# Patient Record
Sex: Female | Born: 1970 | Race: White | Hispanic: No | Marital: Married | Smoking: Former smoker
Health system: Southern US, Community
[De-identification: ages and names within clinical notes are randomized; demographics above are authoritative.]

## PROBLEM LIST (undated history)

## (undated) DIAGNOSIS — I10 Essential (primary) hypertension: Secondary | ICD-10-CM

## (undated) DIAGNOSIS — R600 Localized edema: Secondary | ICD-10-CM

## (undated) DIAGNOSIS — R011 Cardiac murmur, unspecified: Secondary | ICD-10-CM

## (undated) DIAGNOSIS — L732 Hidradenitis suppurativa: Secondary | ICD-10-CM

## (undated) DIAGNOSIS — E119 Type 2 diabetes mellitus without complications: Secondary | ICD-10-CM

## (undated) DIAGNOSIS — I729 Aneurysm of unspecified site: Secondary | ICD-10-CM

## (undated) DIAGNOSIS — E785 Hyperlipidemia, unspecified: Secondary | ICD-10-CM

## (undated) DIAGNOSIS — G4730 Sleep apnea, unspecified: Secondary | ICD-10-CM

## (undated) DIAGNOSIS — R350 Frequency of micturition: Secondary | ICD-10-CM

## (undated) DIAGNOSIS — F419 Anxiety disorder, unspecified: Secondary | ICD-10-CM

## (undated) DIAGNOSIS — E538 Deficiency of other specified B group vitamins: Secondary | ICD-10-CM

## (undated) DIAGNOSIS — E559 Vitamin D deficiency, unspecified: Secondary | ICD-10-CM

## (undated) DIAGNOSIS — E1165 Type 2 diabetes mellitus with hyperglycemia: Secondary | ICD-10-CM

## (undated) HISTORY — DX: Aneurysm of unspecified site: I72.9

## (undated) HISTORY — DX: Type 2 diabetes mellitus with hyperglycemia: E11.65

## (undated) HISTORY — DX: Sleep apnea, unspecified: G47.30

## (undated) HISTORY — DX: Hidradenitis suppurativa: L73.2

## (undated) HISTORY — DX: Type 2 diabetes mellitus without complications: E11.9

## (undated) HISTORY — DX: Essential (primary) hypertension: I10

## (undated) HISTORY — DX: Frequency of micturition: R35.0

## (undated) HISTORY — DX: Cardiac murmur, unspecified: R01.1

## (undated) HISTORY — DX: Localized edema: R60.0

## (undated) HISTORY — PX: LEEP: SHX91

## (undated) HISTORY — DX: Deficiency of other specified B group vitamins: E53.8

## (undated) HISTORY — DX: Hyperlipidemia, unspecified: E78.5

## (undated) HISTORY — DX: Vitamin D deficiency, unspecified: E55.9

## (undated) HISTORY — DX: Anxiety disorder, unspecified: F41.9

---

## 1998-07-14 ENCOUNTER — Ambulatory Visit (HOSPITAL_COMMUNITY): Admission: RE | Admit: 1998-07-14 | Discharge: 1998-07-14 | Payer: Self-pay | Admitting: Obstetrics and Gynecology

## 2004-12-03 ENCOUNTER — Other Ambulatory Visit: Admission: RE | Admit: 2004-12-03 | Discharge: 2004-12-03 | Payer: Self-pay | Admitting: Family Medicine

## 2005-04-12 ENCOUNTER — Encounter (INDEPENDENT_AMBULATORY_CARE_PROVIDER_SITE_OTHER): Payer: Self-pay | Admitting: Specialist

## 2005-04-12 ENCOUNTER — Ambulatory Visit (HOSPITAL_COMMUNITY): Admission: RE | Admit: 2005-04-12 | Discharge: 2005-04-12 | Payer: Self-pay | Admitting: Obstetrics and Gynecology

## 2006-01-08 ENCOUNTER — Other Ambulatory Visit: Admission: RE | Admit: 2006-01-08 | Discharge: 2006-01-08 | Payer: Self-pay | Admitting: Obstetrics and Gynecology

## 2007-01-12 ENCOUNTER — Other Ambulatory Visit: Admission: RE | Admit: 2007-01-12 | Discharge: 2007-01-12 | Payer: Self-pay | Admitting: Family Medicine

## 2008-02-22 ENCOUNTER — Other Ambulatory Visit: Admission: RE | Admit: 2008-02-22 | Discharge: 2008-02-22 | Payer: Self-pay | Admitting: Family Medicine

## 2009-02-24 ENCOUNTER — Other Ambulatory Visit: Admission: RE | Admit: 2009-02-24 | Discharge: 2009-02-24 | Payer: Self-pay | Admitting: Family Medicine

## 2009-08-15 ENCOUNTER — Encounter: Admission: RE | Admit: 2009-08-15 | Discharge: 2009-08-15 | Payer: Self-pay | Admitting: Family Medicine

## 2010-03-08 ENCOUNTER — Other Ambulatory Visit
Admission: RE | Admit: 2010-03-08 | Discharge: 2010-03-08 | Payer: Self-pay | Source: Home / Self Care | Admitting: Family Medicine

## 2010-03-08 ENCOUNTER — Other Ambulatory Visit: Payer: Self-pay | Admitting: Family Medicine

## 2010-07-06 NOTE — H&P (Signed)
Renee Stevens, HOESCHEN               ACCOUNT NO.:  [id number]   MEDICAL RECORD NO.:  [id number]          PATIENT TYPE:  AMB   LOCATION:  [facility]C                           FACILITY:  [facility]   PHYSICIAN:  Janine Limbo, M.D.DATE OF BIRTH:  03-22-1970   DATE OF ADMISSION:  DATE OF DISCHARGE:                                HISTORY & PHYSICAL   HISTORY OF PRESENT ILLNESS:  Renee Stevens is a 40 year old female, para 1-0-1-  1, who presents for a conization of the cervix.  The patient had a Pap smear  in October of 2006 that showed a low grade squamous intraepithelial lesion.  Colposcopically directed biopsies were performed in December of 2006 and it  returned showing a low grade squamous intraepithelial lesion.  The patient  has had cryotherapy in the past as well as a LEEP procedure.  The colposcopy  in December of 2006 was thought to be inadequate because the transition zone  could not be seen.  The patient was noted to have a positive test for the  human papilloma virus.  The patient is currently receiving Depo-Provera.   OBSTETRICAL HISTORY:  The patient has had one term vaginal delivery and one  miscarriage.  The patient did have a D&C after her miscarriage.   ALLERGIES:  Drug allergies:  __________.   PAST MEDICAL HISTORY:  The patient has a history of migraine headaches as  well as a history of high blood pressure.  She is currently taking two  medicines for her blood pressure.  She also takes Lovastatin.  This is for  high cholesterol.  She takes the Depo-Provera shot for contraception.   SOCIAL HISTORY:  The patient smokes one half pack of cigarettes each day.  She denies alcohol use and other recreational drug uses.   REVIEW OF SYSTEMS:  Noncontributory.   FAMILY HISTORY:  The patient has a family history of high blood pressure and  strokes.   PHYSICAL EXAMINATION:  VITAL SIGNS:  Weight is 152 pounds, height is 5 feet  4 inches.  HEENT:  Within normal limits.  CHEST:   Clear.  HEART:  Regular rate and rhythm.  BREASTS:  Without masses.  ABDOMEN:  Nontender.  EXTREMITIES:  Grossly normal.  NEUROLOGIC:  Exam is grossly normal.  PELVIC:  External genitalia is normal.  Vagina is normal.  Cervix is  nontender.  Adnexa:  No masses.  Rectovaginal exam confirms.  Uterus is  normal size, shape, and consistency.   ASSESSMENT:  1.  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      Colposcopy is considered inadequate because the transition zone could      not be completely seen.  2.  Human papilloma virus.   PLAN:  The patient will undergo a conization of the cervix.  She understands  the indications for her procedure and she accepts the risk of, but not  limited to, anesthetic complications, bleeding, infections, possible damage  to the surrounding organs, and possible weakening of the cervix which may  put the patient at risk in the future for preterm labor.  Janine Limbo, M.D.  Electronically Signed     AVS/MEDQ  D:  04/09/2005  T:  04/09/2005  Job:  [id number]   cc:   Duncan Dull, M.D.  Fax: [phone]

## 2010-07-06 NOTE — Op Note (Signed)
Renee Stevens, Renee Stevens               ACCOUNT NO.:  [id number]   MEDICAL RECORD NO.:  [id number]          PATIENT TYPE:  AMB   LOCATION:  [facility]C                           FACILITY:  [facility]   PHYSICIAN:  Janine Limbo, M.D.DATE OF BIRTH:  1970-08-11   DATE OF PROCEDURE:  04/12/2005  DATE OF DISCHARGE:                                 OPERATIVE REPORT   PREOPERATIVE DIAGNOSIS:  1.  Recurrent cervical intraepithelial neoplasia.  2.  Inadequate colposcopy.   POSTOPERATIVE DIAGNOSIS:  1.  Recurrent cervical intraepithelial neoplasia.  2.  Inadequate colposcopy.   PROCEDURE:  Cold knife conization of the cervix.   SURGEON:  Janine Limbo, M.D.   ASSISTANT:  None.   ANESTHETIC:  Was monitored anesthetic control, local 1/2% Marcaine with  Pitressin.   DISPOSITION:  Renee Stevens is a 40 year old female, para 1-0-1-1, who presents  with the above-mentioned diagnosis. The patient has had cryotherapy in the  past as well as a LEEP procedure in the past. Her Pap smear again returned  showing a cervical intraepithelial neoplasia I. She has a known history of  the human papilloma virus. Colposcopy was performed and the transition zone  could not be seen. The patient presents for conization. She understands the  indications for procedure and she accepts the risks of, but not limited to,  anesthetic complications, bleeding, infection, and possible damage to  surrounding organs. She understands there is a small chance that her cervix  may be weakened and that preterm labor may be of concern with her next  pregnancy.   FINDINGS:  Lugol's solution was placed on the cervix and there were no  nonstaining areas on the exocervix. The uterus was upper limits normal size  and no adnexal masses were appreciated.   PROCEDURE:  The patient was taken to the operating room where she was given  medication through her IV line. She was placed in a lithotomy position. Her  perineum and vagina were prepped  with multiple layers of Betadine. The  bladder was drained of urine. Examination under anesthesia was performed.  The patient was sterilely draped. The cervix was injected with 18 mL of half  percent Marcaine with Pitressin. Figure-of-eight sutures were placed at the  3 o'clock and 9 o'clock positions of the cervix. The cervix was sounded. A  circumferential incision was made around the cervical os and the incision  was extended in a cone-like fashion to the endocervix. A stitch was placed  at the 12 o'clock position of the conization specimen for identification.  Inverting sutures were then placed at the 3 o'clock, 6 o'clock, 9 o'clock,  and 12 o'clock positions of the cervix. Hemostasis was noted to be adequate  throughout. The endocervix was again sounded. Examination was repeated and  the cervix was noted to be firm. All instruments were removed. The estimated  blood loss was 2 mL. Sponge, needle, instrument counts were correct. The  patient was awakened from her anesthetic without difficulty and taken to the  recovery room in stable condition. She tolerated her procedure well.   FOLLOW-UP INSTRUCTIONS:  The patient  will be given a prescription for  Vicodin and she will take 1 or 2 tablets every 4 hours as needed for pain.  She will call for questions or concerns. She was given a copy of the  postoperative instruction sheet as prepared by the [facility] of  [facility] for patients who have undergone a dilatation and curettage but  then that instruction sheet was modified for a conization of the cervix.      Janine Limbo, M.D.  Electronically Signed     AVS/MEDQ  D:  04/12/2005  T:  04/12/2005  Job:  [id number]

## 2010-12-06 ENCOUNTER — Other Ambulatory Visit (HOSPITAL_COMMUNITY): Payer: Self-pay | Admitting: Family Medicine

## 2010-12-06 DIAGNOSIS — Z1231 Encounter for screening mammogram for malignant neoplasm of breast: Secondary | ICD-10-CM

## 2010-12-27 ENCOUNTER — Ambulatory Visit (HOSPITAL_COMMUNITY)
Admission: RE | Admit: 2010-12-27 | Discharge: 2010-12-27 | Disposition: A | Discharge status: Home / Self Care | Payer: Managed Care, Other (non HMO) | Source: Ambulatory Visit | Attending: Family Medicine | Admitting: Family Medicine

## 2010-12-27 DIAGNOSIS — Z1231 Encounter for screening mammogram for malignant neoplasm of breast: Secondary | ICD-10-CM | POA: Insufficient documentation

## 2011-04-05 ENCOUNTER — Other Ambulatory Visit: Payer: Self-pay | Admitting: Surgery

## 2013-04-29 ENCOUNTER — Other Ambulatory Visit: Payer: Self-pay | Admitting: Family Medicine

## 2013-04-29 ENCOUNTER — Other Ambulatory Visit (HOSPITAL_COMMUNITY)
Admission: RE | Admit: 2013-04-29 | Discharge: 2013-04-29 | Disposition: A | Discharge status: Home / Self Care | Payer: Managed Care, Other (non HMO) | Source: Ambulatory Visit | Attending: Family Medicine | Admitting: Family Medicine

## 2013-04-29 DIAGNOSIS — Z124 Encounter for screening for malignant neoplasm of cervix: Secondary | ICD-10-CM | POA: Insufficient documentation

## 2013-05-22 ENCOUNTER — Encounter: Payer: Self-pay | Admitting: *Deleted

## 2013-05-22 DIAGNOSIS — E559 Vitamin D deficiency, unspecified: Secondary | ICD-10-CM

## 2013-05-22 DIAGNOSIS — E119 Type 2 diabetes mellitus without complications: Secondary | ICD-10-CM | POA: Insufficient documentation

## 2013-05-22 DIAGNOSIS — R6889 Other general symptoms and signs: Secondary | ICD-10-CM

## 2013-05-22 DIAGNOSIS — E7800 Pure hypercholesterolemia, unspecified: Secondary | ICD-10-CM

## 2013-05-22 DIAGNOSIS — I10 Essential (primary) hypertension: Secondary | ICD-10-CM | POA: Insufficient documentation

## 2014-03-08 ENCOUNTER — Telehealth: Payer: Self-pay | Admitting: Neurology

## 2014-03-08 NOTE — Telephone Encounter (Signed)
PT MOVED APPT TO 03-11-14 FROM 03-30-14

## 2014-03-09 ENCOUNTER — Telehealth: Payer: Self-pay | Admitting: Neurology

## 2014-03-09 NOTE — Telephone Encounter (Signed)
Pt called and resch her new patient appt from 03-11-14 to 04-19-14

## 2014-03-11 ENCOUNTER — Ambulatory Visit: Payer: Managed Care, Other (non HMO) | Admitting: Neurology

## 2014-03-30 ENCOUNTER — Ambulatory Visit: Payer: Managed Care, Other (non HMO) | Admitting: Neurology

## 2014-04-19 ENCOUNTER — Encounter: Payer: Self-pay | Admitting: Neurology

## 2014-04-19 ENCOUNTER — Ambulatory Visit (INDEPENDENT_AMBULATORY_CARE_PROVIDER_SITE_OTHER): Payer: Managed Care, Other (non HMO) | Admitting: Neurology

## 2014-04-19 VITALS — BP 122/62 | Temp 98.0°F | Ht 65.0 in | Wt 232.3 lb

## 2014-04-19 DIAGNOSIS — Z8249 Family history of ischemic heart disease and other diseases of the circulatory system: Secondary | ICD-10-CM

## 2014-04-19 DIAGNOSIS — G4441 Drug-induced headache, not elsewhere classified, intractable: Secondary | ICD-10-CM

## 2014-04-19 DIAGNOSIS — G4440 Drug-induced headache, not elsewhere classified, not intractable: Secondary | ICD-10-CM

## 2014-04-19 DIAGNOSIS — I671 Cerebral aneurysm, nonruptured: Secondary | ICD-10-CM

## 2014-04-19 DIAGNOSIS — G43019 Migraine without aura, intractable, without status migrainosus: Secondary | ICD-10-CM

## 2014-04-19 MED ORDER — NORTRIPTYLINE HCL 25 MG PO CAPS: 25.0000 mg | ORAL_CAPSULE | Freq: Every day | ORAL | Status: DC

## 2014-04-19 NOTE — Progress Notes (Signed)
NEUROLOGY CONSULTATION NOTE  LISSET KETCHEM MRN: [id number] DOB: August 18, 1970  Referring provider: Shaune Pollack Primary care provider: Shaune Pollack  Reason for consult:  headache  HISTORY OF PRESENT ILLNESS: Renee Stevens is a 44 year old right-handed woman with hypertension, hyperlipidemia, type 2 diabetes, migraine and cerebral aneurysm who presents for chronic headache.  Onset:  All of her adult life but gradually worse over the past 2 years. Location:  Bilateral retro-orbital, rarely on the crown or forehead. Quality:  pounding Intensity:  1-2/10 (5 when severe) Aura:  no Prodrome:  no Associated symptoms:  Sometimes photophobia, runny nose or eyes.  No nausea, phonophobia or visual disturbance. Duration:  3 days Frequency:  Only one or two days without headache per month Triggers/exacerbating factors:  none Relieving factors:  Aleve Activity:  Able to function  Past abortive therapy:  Tylenol (ineffective), Aleve (helped) Past preventative therapy:  Topiramate (cognitive problems, used for only short period of time)  Current abortive therapy:  Advil (takes at least 5 days per week) Current preventative therapy:  None, but takes atenolol  for blood pressure Other medications:  Depo-Provera  Caffeine:  Diet soda daily, 1 to 2 cups of coffee daily Alcohol:  no Smoker:  no Diet:  No specific diet.  Does not drink water Exercise:  Walks the dog 30 minutes a day Depression/stress:  No.  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 Sleep hygiene:  Poor.  She works third shift [employer]    MEDICATIONS: Current Outpatient Prescriptions on File Prior to Visit  Medication Sig Dispense Refill  . atenolol (TENORMIN) 50 MG tablet Take 50 mg by mouth daily.    Marland Kitchen atorvastatin (LIPITOR) 80 MG tablet Take 80 mg by mouth daily.    Marland Kitchen glimepiride (AMARYL) 4 MG tablet Take 4 mg by mouth daily with breakfast.    . glucose blood test strip 1 each by Other route as needed for other. Use as instructed    . ibuprofen (ADVIL,MOTRIN) 200 MG tablet Take 200 mg by mouth every 6 (six) hours as needed.    Marland Kitchen lisinopril-hydrochlorothiazide (PRINZIDE,ZESTORETIC) 10-12.5 MG per tablet Take 1 tablet by mouth daily.    . medroxyPROGESTERone (DEPO-PROVERA) 150 MG/ML injection Inject 150 mg into the muscle every 3 (three) months.    . metFORMIN (GLUCOPHAGE) 500 MG tablet Take by mouth 2 (two) times daily with a meal.    . pioglitazone (ACTOS) 15 MG tablet Take 15 mg by mouth daily.    . Vitamin D, Ergocalciferol, (DRISDOL) 50000 UNITS CAPS capsule Take 50,000 Units by mouth every 7 (seven) days.     No current facility-administered medications on file prior to visit.    ALLERGIES: No Known Allergies  FAMILY HISTORY: Family History  Problem Relation Age of Onset  . Hypertension Mother   . Aneurysm Sister   . Stroke  Maternal Grandmother     SOCIAL HISTORY: History   Social History  . Marital Status: Single    Spouse Name: N/A  . Number of Children: N/A  . Years of Education: N/A   Occupational History  . Not on file.   Social History Main Topics  . Smoking status: Former Smoker    Quit date: 02/02/2012  . Smokeless tobacco: Never Used  . Alcohol Use: No  . Drug Use: No  . Sexual Activity:    Partners: Male   Other Topics Concern  . Not on file   Social History Narrative    REVIEW OF SYSTEMS: Constitutional: No  fevers, chills, or sweats, no generalized fatigue, change in appetite Eyes: No visual changes, double vision, eye pain Ear, nose and throat: No hearing loss, ear pain, nasal congestion, sore throat Cardiovascular: No chest pain, palpitations Respiratory:  No shortness of breath at rest or with exertion, wheezes GastrointestinaI: No nausea, vomiting, diarrhea, abdominal pain, fecal incontinence Genitourinary:  No dysuria, urinary retention or frequency Musculoskeletal:  No neck pain, back pain Integumentary: No rash, pruritus, skin lesions Neurological: as above Psychiatric: No depression, insomnia, anxiety Endocrine: No palpitations, fatigue, diaphoresis, mood swings, change in appetite, change in weight, increased thirst Hematologic/Lymphatic:  No anemia, purpura, petechiae. Allergic/Immunologic: no itchy/runny eyes, nasal congestion, recent allergic reactions, rashes  PHYSICAL EXAM: Filed Vitals:   04/19/14 0926  BP: 122/62  Temp: 98 F (36.7 C)   General: No acute distress Head:  Normocephalic/atraumatic Eyes:  fundi unremarkable, without vessel changes, exudates, hemorrhages or papilledema. Neck: supple, no paraspinal tenderness, full range of motion Back: No paraspinal tenderness Heart: regular rate and rhythm Lungs: Clear to auscultation bilaterally. Vascular: No carotid bruits. Neurological Exam: Mental status: alert and oriented to person, place, and time, recent and remote memory intact, fund of knowledge intact, attention and concentration intact, speech fluent and not dysarthric, language intact. Cranial nerves: CN I: not tested CN II: pupils equal, round and reactive to light, visual fields intact, fundi unremarkable, without vessel changes, exudates, hemorrhages or papilledema. CN III, IV, VI:  full range of motion, no nystagmus, no ptosis CN V: facial sensation intact CN VII: upper and lower face symmetric CN VIII: hearing intact CN IX, X: gag intact, uvula  midline CN XI: sternocleidomastoid and trapezius muscles intact CN XII: tongue midline Bulk & Tone: normal, no fasciculations. Motor:  5/5 throughout Sensation:  Temperature and vibration intact Deep Tendon Reflexes:  2+ throughout, toes downgoing Finger to nose testing:  No dysmetria Heel to shin:  No dysmetria Gait:  Normal station and stride.  Able to turn and walk in tandem. Romberg negative.  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  Start nortriptyline 25mg  at bedtime 2.  Stop Advil.  Instead, try Excedrin Migraine.  TAKE NO MORE THAN 2 DAYS OUT OF THE WEEK 3.  Stop diet soda.  Instead drink water or seltzer water. Stop coffee 4.  Increase exercise.  Instead of walk with dog, try power walk or light jog. 5.  Follow sleep tips 6.  Consider switching off third shift 7.  Will check MRA of head. 8.  Follow in 3 months.  Thank you for allowing me to take part in the care of this patient.  Shon MilletAdam Betha Shadix, DO  CC:  Shaune Pollackonna Gates

## 2014-04-19 NOTE — Patient Instructions (Addendum)
1.  Start nortriptyline 25mg  at bedtime to try and reduce frequency of headaches.  Call in 4 weeks with update. 2.  Stop Advil.  Instead, try Excedrin Migraine.  TAKE NO MORE THAN 2 DAYS OUT OF THE WEEK 3.  Stop diet soda.  Instead drink water or seltzer water. Stop coffee 4.  Increase exercise.  Instead of walk with dog, try power walk or light jog. 5.  Follow sleep tips 6.  Consider switching off third shift 7.  Will check MRA of head. [address]  [place]  [phone] 04/28/14 7:25am  8.  Follow in 3 months.

## 2014-04-28 ENCOUNTER — Ambulatory Visit
Admission: RE | Admit: 2014-04-28 | Discharge: 2014-04-28 | Disposition: A | Discharge status: Home / Self Care | Payer: Managed Care, Other (non HMO) | Source: Ambulatory Visit | Attending: Neurology | Admitting: Neurology

## 2014-04-28 ENCOUNTER — Telehealth: Payer: Self-pay | Admitting: *Deleted

## 2014-04-28 DIAGNOSIS — G4440 Drug-induced headache, not elsewhere classified, not intractable: Secondary | ICD-10-CM

## 2014-04-28 DIAGNOSIS — I671 Cerebral aneurysm, nonruptured: Secondary | ICD-10-CM

## 2014-04-28 NOTE — Telephone Encounter (Signed)
Patient is aware of normal MRA  

## 2014-04-28 NOTE — Telephone Encounter (Signed)
-----   Message from Drema DallasAdam R Jaffe, DO sent at 04/28/2014 10:00 AM EST ----- MRA of the head shows no evidence of an aneurysm ----- Message -----    From: Rad Results In Interface    Sent: 04/28/2014   9:36 AM      To: Drema DallasAdam R Jaffe, DO

## 2014-06-21 ENCOUNTER — Other Ambulatory Visit: Payer: Self-pay | Admitting: Neurology

## 2014-07-21 ENCOUNTER — Ambulatory Visit: Payer: Managed Care, Other (non HMO) | Admitting: Neurology

## 2014-08-22 ENCOUNTER — Other Ambulatory Visit: Payer: Self-pay | Admitting: Neurology

## 2014-09-09 ENCOUNTER — Encounter: Payer: Self-pay | Admitting: Neurology

## 2014-09-09 ENCOUNTER — Ambulatory Visit (INDEPENDENT_AMBULATORY_CARE_PROVIDER_SITE_OTHER): Payer: Managed Care, Other (non HMO) | Admitting: Neurology

## 2014-09-09 VITALS — BP 140/60 | HR 78 | Resp 18 | Ht 65.0 in | Wt 226.6 lb

## 2014-09-09 DIAGNOSIS — G43719 Chronic migraine without aura, intractable, without status migrainosus: Secondary | ICD-10-CM | POA: Diagnosis not present

## 2014-09-09 MED ORDER — SUMATRIPTAN SUCCINATE 100 MG PO TABS: ORAL_TABLET | ORAL | Status: DC

## 2014-09-09 MED ORDER — NORTRIPTYLINE HCL 50 MG PO CAPS
50.0000 mg | ORAL_CAPSULE | Freq: Every day | ORAL | Status: DC
Start: 2014-09-09 — End: 2015-01-26

## 2014-09-09 NOTE — Progress Notes (Signed)
NEUROLOGY FOLLOW UP OFFICE NOTE  Renee Stevens [id number]  HISTORY OF PRESENT ILLNESS: Renee Stevens is a 44 year old right-handed woman with hypertension, hyperlipidemia, type 2 diabetes, and migrainewho follows up for medication-overuse headache and history (and family history) of cerebral aneurysm.  UPDATE: Due to her reported history of cerebral aneurysm, an MRA of the head was performed on 04/28/14, which was negative for aneurysm.  For the first month, she was doing very well (she was headache-free for one week).  Then headaches gradually increased.  She now has about 12-16 headache days per month, which is still much improved. Intensity:  3/10 (6/10 if severe) Duration:  3 days Frequency:  3-4 days per week Current abortive medication:  Advil (however she does not take at earliest onset of headache).  Excedrin not effective. Current preventative medication:  nortriptyline  Other medications:  Atenolol, Depo  Caffeine:  Stopped soda and coffee.   Alcohol:  no Smoker:  no Diet:  Eating more fresh vegetables and drinking more water Exercise:  Increased daily walks Depression/stress:  No.  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 Sleep hygiene:  Molli Knock, despite work schedule.  She works third shift Sunday, Tuesday and Wednesday, and works day shift Friday.  HISTORY: Onset:  All of her adult life but gradually worse over the past 2 years. Location:  Bilateral retro-orbital, rarely on the crown or forehead. Quality:  pounding Initial Intensity:  1-2/10 (5 when severe) Aura:  no Prodrome:  no Associated symptoms:  Sometimes photophobia, runny nose or eyes.  No nausea, phonophobia or visual disturbance. Initial Duration:  3 days Initial Frequency:  Only one or two days without headache per month Triggers/exacerbating factors:  none Relieving factors:  Aleve Activity:  Able to function  Past abortive therapy:  Tylenol (ineffective), Aleve (helped) Past  preventative therapy:  Topiramate (cognitive problems, used for only short period of time)  Family history of headache:  No.  Sister died of cerebral aneurysm.  Mother has cerebral aneurysms.  Patient has cerebral aneurysm, last checked 10 years ago.  PAST MEDICAL HISTORY: Past Medical History  Diagnosis Date  . Diabetes mellitus without complication   . Hypertension   . Hyperlipidemia   . Sleep apnea   . Vitamin D deficiency   . Anxiety   . Hidradenitis   . Aneurysm     MEDICATIONS: Current Outpatient Prescriptions on File Prior to Visit  Medication Sig Dispense Refill  . atenolol (TENORMIN) 50 MG tablet Take 50 mg by mouth daily.    Marland Kitchen atorvastatin (LIPITOR) 80 MG tablet Take 80 mg by mouth daily.    Marland Kitchen glimepiride (AMARYL) 4 MG tablet Take 4 mg by mouth daily with breakfast.    . glucose blood test strip 1 each by Other route as needed for other. Use as instructed    . ibuprofen (ADVIL,MOTRIN) 200 MG tablet Take 200 mg by mouth every 6 (six) hours as needed.    Marland Kitchen lisinopril-hydrochlorothiazide (PRINZIDE,ZESTORETIC) 10-12.5 MG per tablet Take 1 tablet by mouth daily.    . medroxyPROGESTERone (DEPO-PROVERA) 150 MG/ML injection Inject 150 mg into the muscle every 3 (three) months.    . metFORMIN (GLUCOPHAGE) 500 MG tablet Take by mouth 2 (two) times daily with a meal.    . metFORMIN (GLUCOPHAGE-XR) 500 MG 24 hr tablet     . pioglitazone (ACTOS) 15 MG tablet Take 15 mg by mouth daily.    . pioglitazone (ACTOS) 30 MG tablet     .  Vitamin D, Ergocalciferol, (DRISDOL) 50000 UNITS CAPS capsule Take 50,000 Units by mouth every 7 (seven) days.     No current facility-administered medications on file prior to visit.    ALLERGIES: No Known Allergies  FAMILY HISTORY: Family History  Problem Relation Age of Onset  . Hypertension Mother   . Aneurysm Sister   . Stroke Maternal Grandmother     SOCIAL HISTORY: History   Social History  . Marital Status: Single    Spouse Name: N/A    . Number of Children: N/A  . Years of Education: N/A   Occupational History  . Not on file.   Social History Main Topics  . Smoking status: Former Smoker    Quit date: 02/02/2012  . Smokeless tobacco: Never Used  . Alcohol Use: No  . Drug Use: No  . Sexual Activity:    Partners: Male   Other Topics Concern  . Not on file   Social History Narrative    REVIEW OF SYSTEMS: Constitutional: No fevers, chills, or sweats, no generalized fatigue, change in appetite Eyes: No visual changes, double vision, eye pain Ear, nose and throat: No hearing loss, ear pain, nasal congestion, sore throat Cardiovascular: No chest pain, palpitations Respiratory:  No shortness of breath at rest or with exertion, wheezes GastrointestinaI: No nausea, vomiting, diarrhea, abdominal pain, fecal incontinence Genitourinary:  No dysuria, urinary retention or frequency Musculoskeletal:  No neck pain, back pain Integumentary: No rash, pruritus, skin lesions Neurological: as above Psychiatric: No depression, insomnia, anxiety Endocrine: No palpitations, fatigue, diaphoresis, mood swings, change in appetite, change in weight, increased thirst Hematologic/Lymphatic:  No anemia, purpura, petechiae. Allergic/Immunologic: no itchy/runny eyes, nasal congestion, recent allergic reactions, rashes  PHYSICAL EXAM: Filed Vitals:   09/09/14 1100  BP: 140/60  Pulse: 78  Resp: 18   General: No acute distress.  Patient appears well-groomed. . Head:  Normocephalic/atraumatic Eyes:  Fundoscopic exam unremarkable without vessel changes, exudates, hemorrhages or papilledema. Neck: supple, no paraspinal tenderness, full range of motion Heart:  Regular rate and rhythm Lungs:  Clear to auscultation bilaterally Back: No paraspinal tenderness Neurological Exam: alert and oriented to person, place, and time. Attention span and concentration intact, recent and remote memory intact, fund of knowledge intact.  Speech fluent and  not dysarthric, language intact.  CN II-XII intact. Fundoscopic exam unremarkable without vessel changes, exudates, hemorrhages or papilledema.  Bulk and tone normal, muscle strength 5/5 throughout.  Sensation to light touch, temperature and vibration intact.  Deep tendon reflexes 2+ throughout, toes downgoing.  Finger to nose and heel to shin testing intact.  Gait normal, Romberg negative.  IMPRESSION: Chronic migraine without aura, intractable  PLAN: 1.  Increase nortriptyline to 50mg  at bedtime and she should call in 4 weeks with update 2.  Since she does not have a cerebral aneurysm, then we will start sumatriptan 100mg  for abortive therapy.  If it causes excessive drowsiness to the point that she cannot function at work, I would recommend taking naproxen 500mg .  Should be taken at earliest onset of headache. 3.  Monitor BP with PCP. 4.  Follow up in 3 months.  15 minutes spent face to face with patient, over 50% spent discussing management.  Shon Millet, DO  CC:  Shaune Pollack

## 2014-09-09 NOTE — Patient Instructions (Signed)
Migraine Recommendations: 1.  Increase nortriptyline to  at bedtime.  Call in 4 weeks with update and we can adjust dose if needed. 2.  Take sumatriptan  at earliest onset of headache.  May repeat dose once in 2 hours if needed.  Do not exceed two tablets in 24 hours.  If it causes too much drowsiness to function at work, take naproxen (Aleve)  at earliest onset of the headache (may repeat in 12 hours if needed) 3.  Limit use of pain relievers to no more than 2 days out of the week.  These medications include acetaminophen, ibuprofen, triptans and narcotics.  This will help reduce risk of rebound headaches. 4.  Be aware of common food triggers such as processed sweets, processed foods with nitrites (such as deli meat, hot dogs, sausages), foods with MSG, alcohol (such as wine), chocolate, certain cheeses, certain fruits (dried fruits, some citrus fruit), vinegar, diet soda. 4.  Avoid caffeine 5.  Routine exercise 6.  Proper sleep hygiene 7.  Stay adequately hydrated with water 8.  Keep a headache diary. 9.  Maintain proper stress management. 10.  Do not skip meals. 11.  Follow up in 3 months.

## 2014-09-12 ENCOUNTER — Other Ambulatory Visit: Payer: Self-pay | Admitting: Family Medicine

## 2014-09-12 ENCOUNTER — Other Ambulatory Visit (HOSPITAL_COMMUNITY)
Admission: RE | Admit: 2014-09-12 | Discharge: 2014-09-12 | Disposition: A | Discharge status: Home / Self Care | Payer: Managed Care, Other (non HMO) | Source: Ambulatory Visit | Attending: Family Medicine | Admitting: Family Medicine

## 2014-09-12 DIAGNOSIS — Z01419 Encounter for gynecological examination (general) (routine) without abnormal findings: Secondary | ICD-10-CM | POA: Diagnosis not present

## 2014-09-13 LAB — CYTOLOGY - PAP

## 2014-10-05 ENCOUNTER — Other Ambulatory Visit (HOSPITAL_COMMUNITY)
Admission: RE | Admit: 2014-10-05 | Discharge: 2014-10-05 | Disposition: A | Discharge status: Home / Self Care | Payer: Managed Care, Other (non HMO) | Source: Ambulatory Visit | Attending: Family Medicine | Admitting: Family Medicine

## 2014-10-05 ENCOUNTER — Other Ambulatory Visit: Payer: Self-pay | Admitting: Family Medicine

## 2014-10-05 ENCOUNTER — Other Ambulatory Visit: Payer: Self-pay

## 2014-10-05 DIAGNOSIS — Z1231 Encounter for screening mammogram for malignant neoplasm of breast: Secondary | ICD-10-CM

## 2014-10-05 DIAGNOSIS — Z1151 Encounter for screening for human papillomavirus (HPV): Secondary | ICD-10-CM | POA: Diagnosis present

## 2014-10-07 LAB — CYTOLOGY - PAP

## 2014-10-31 ENCOUNTER — Ambulatory Visit
Admission: RE | Admit: 2014-10-31 | Discharge: 2014-10-31 | Disposition: A | Discharge status: Home / Self Care | Payer: Managed Care, Other (non HMO) | Source: Ambulatory Visit

## 2014-10-31 DIAGNOSIS — Z1231 Encounter for screening mammogram for malignant neoplasm of breast: Secondary | ICD-10-CM

## 2014-12-12 ENCOUNTER — Ambulatory Visit: Payer: Managed Care, Other (non HMO) | Admitting: Neurology

## 2015-01-26 ENCOUNTER — Other Ambulatory Visit: Payer: Self-pay | Admitting: Neurology

## 2015-01-27 NOTE — Telephone Encounter (Signed)
Last OV: 09/09/14 Next OV: 03/22/15

## 2015-03-22 ENCOUNTER — Ambulatory Visit: Payer: Managed Care, Other (non HMO) | Admitting: Neurology

## 2015-10-05 ENCOUNTER — Ambulatory Visit (INDEPENDENT_AMBULATORY_CARE_PROVIDER_SITE_OTHER): Payer: Managed Care, Other (non HMO) | Admitting: Podiatry

## 2015-10-05 ENCOUNTER — Encounter: Payer: Self-pay | Admitting: Podiatry

## 2015-10-05 ENCOUNTER — Ambulatory Visit (INDEPENDENT_AMBULATORY_CARE_PROVIDER_SITE_OTHER): Payer: Managed Care, Other (non HMO)

## 2015-10-05 VITALS — BP 127/77 | HR 100 | Resp 12

## 2015-10-05 DIAGNOSIS — M2010 Hallux valgus (acquired), unspecified foot: Secondary | ICD-10-CM

## 2015-10-05 DIAGNOSIS — M722 Plantar fascial fibromatosis: Secondary | ICD-10-CM

## 2015-10-05 MED ORDER — MELOXICAM 15 MG PO TABS: 15.0000 mg | ORAL_TABLET | Freq: Every day | ORAL | 3 refills | Status: DC

## 2015-10-05 NOTE — Progress Notes (Signed)
   Subjective:    Patient ID: Renee Stevens, female    DOB: [birth year], 45 y.o.   MRN: [id number]  HPI: She presents today with a chief complaint of bilateral lateral side of the foot pain along the fifth metatarsal from the fifth metatarsophalangeal joint proximally to the fifth metatarsocuboid joint area and she points to this area. She states is been going on now for the past 2-3 months these seem to be getting worse after she sitting and gets back up to walk she noticed that her bunions are starting to hurt and she's tried no treatment. She does relate that she has diabetes.    Review of Systems  Musculoskeletal: Positive for gait problem.       Objective:   Physical Exam: Vital signs are stable she is alert and oriented 3 pulses are strongly palpable. Neurologic sensorium is intact. Deep tendon reflexes are intact bilateral muscle strength +5 over 5 dorsiflexion plantar flexors and inverters and everters all intrinsic musculature is intact. Orthopedic evaluation and x-rays all joints distal to the ankle for range of motion without crepitation. 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 Radiographs 3 views taken today in the office demonstrate plantar distally oriented calcaneal heel spurs and soft tissue increased density at plantar fascia calcaneal insertion site. 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 Cutaneous evaluation does not demonstrates a type open lesions no signs of infection.        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  Plan: Placed her on meloxicam. Injected the  bilateral heels today with Kenalog and local anesthetic. Placed her plantar fascia braces and a single night splint. Discussed appropriate shoe gear the exercises ice therapy shoe gear modification dispensed plantar fascia exercises.

## 2015-11-02 ENCOUNTER — Ambulatory Visit: Payer: Managed Care, Other (non HMO) | Admitting: Podiatry

## 2015-11-09 ENCOUNTER — Ambulatory Visit (INDEPENDENT_AMBULATORY_CARE_PROVIDER_SITE_OTHER): Payer: Managed Care, Other (non HMO) | Admitting: Podiatry

## 2015-11-09 ENCOUNTER — Encounter: Payer: Self-pay | Admitting: Podiatry

## 2015-11-09 DIAGNOSIS — M722 Plantar fascial fibromatosis: Secondary | ICD-10-CM | POA: Diagnosis not present

## 2015-11-09 NOTE — Patient Instructions (Signed)

## 2015-11-09 NOTE — Progress Notes (Signed)
At this point she presents for follow-up of bilateral plantar fasciitis she states that they felt better for just a few days and that the meloxicam really doesn't seem to be doing anything at this point.  Objective: Vital signs are stable she is alert and oriented 3 presents in flip-flops today. She is pain on palpation medial continued tubercle bilateral. Pulses remain palpable.  Assessment: Fasciitis bilateral.  Plan: Reinjected the bilateral heels that with Kenalog and local anesthetic suggest that she continue on the meloxicam and discussed proper shoe gear once again. Follow-up with her in 1 month. I also recommended the stretching exercises from last visit.

## 2015-12-07 ENCOUNTER — Ambulatory Visit (INDEPENDENT_AMBULATORY_CARE_PROVIDER_SITE_OTHER): Payer: Managed Care, Other (non HMO) | Admitting: Podiatry

## 2015-12-07 ENCOUNTER — Encounter: Payer: Self-pay | Admitting: Podiatry

## 2015-12-07 DIAGNOSIS — M722 Plantar fascial fibromatosis: Secondary | ICD-10-CM | POA: Diagnosis not present

## 2015-12-07 MED ORDER — DICLOFENAC SODIUM 75 MG PO TBEC: 75.0000 mg | DELAYED_RELEASE_TABLET | Freq: Two times a day (BID) | ORAL | 3 refills | Status: DC

## 2015-12-07 NOTE — Progress Notes (Signed)
She presents today for follow-up of bilateral plantar fasciitis. She states that she like to have another set of injections.  Objective: Vital signs are stable alert and oriented 3 pulses are palpable. No calf pain. She has pain on direct palpation medial calcaneal tubercles bilateral.  Assessment: Plantar fasciitis bilateral.  Plan: Reinjected bilateral heels today. Changed her medication to diclofenac and I will follow-up with her in 1 month's

## 2015-12-08 DIAGNOSIS — M722 Plantar fascial fibromatosis: Secondary | ICD-10-CM | POA: Diagnosis not present

## 2015-12-28 ENCOUNTER — Encounter: Payer: Self-pay | Admitting: Podiatry

## 2015-12-28 ENCOUNTER — Ambulatory Visit (INDEPENDENT_AMBULATORY_CARE_PROVIDER_SITE_OTHER): Payer: Managed Care, Other (non HMO) | Admitting: Podiatry

## 2015-12-28 DIAGNOSIS — M722 Plantar fascial fibromatosis: Secondary | ICD-10-CM

## 2015-12-28 NOTE — Progress Notes (Signed)
Dispensed patient's orthotics with oral and written instructions for wearing. Patient will follow up with Dr. Hyatt in 1 month for an orthotic check. 

## 2015-12-28 NOTE — Patient Instructions (Signed)

## 2016-01-25 ENCOUNTER — Ambulatory Visit: Payer: Managed Care, Other (non HMO) | Admitting: Podiatry

## 2016-02-08 ENCOUNTER — Encounter: Payer: Self-pay | Admitting: Podiatry

## 2016-02-08 ENCOUNTER — Ambulatory Visit (INDEPENDENT_AMBULATORY_CARE_PROVIDER_SITE_OTHER): Payer: Managed Care, Other (non HMO) | Admitting: Podiatry

## 2016-02-08 DIAGNOSIS — M779 Enthesopathy, unspecified: Principal | ICD-10-CM

## 2016-02-08 DIAGNOSIS — G629 Polyneuropathy, unspecified: Secondary | ICD-10-CM | POA: Diagnosis not present

## 2016-02-08 DIAGNOSIS — L600 Ingrowing nail: Secondary | ICD-10-CM | POA: Diagnosis not present

## 2016-02-08 DIAGNOSIS — M778 Other enthesopathies, not elsewhere classified: Secondary | ICD-10-CM

## 2016-02-08 DIAGNOSIS — M7750 Other enthesopathy of unspecified foot: Secondary | ICD-10-CM | POA: Diagnosis not present

## 2016-02-08 MED ORDER — GABAPENTIN 100 MG PO CAPS: 100.0000 mg | ORAL_CAPSULE | Freq: Every day | ORAL | 0 refills | Status: DC

## 2016-02-08 MED ORDER — NEOMYCIN-POLYMYXIN-HC 1 % OT SOLN: OTIC | 1 refills | Status: DC

## 2016-02-08 MED ORDER — CELECOXIB 200 MG PO CAPS: 200.0000 mg | ORAL_CAPSULE | Freq: Two times a day (BID) | ORAL | 2 refills | Status: DC

## 2016-02-08 NOTE — Progress Notes (Signed)
She presents today for follow-up of her plantar fasciitis. She states that the injection seemed to last for about 9-10 days which is much better than it was initially when she states that the orthotics really seem to be bothering her in the diclofenac doesn't seem to be helping at all. She states that as of late she has started to become more used to the orthotics. She has a history of diabetes with a sharp ingrown toenail tibial and fibular border of the hallux right. She also has nocturnal pain and pain in her feet all the time she relates as a burning aching pain.  Objective: Vital signs are stable she is alert and oriented 3. Pulses are palpable. She has no real decrease in sensorium today. She does have a sharp radial nail margin. She still has tenderness on palpation medial calcaneal tubercles bilateral.  Assessment: Diabetes mellitus with diabetic neuropathy. Plantar fasciitis. Capsulitis of the fourth and fifth metatarsocuboid articulation bilateral.  Plan: I injected the fourth fifth met cuboid articulation bilaterally today. I also performed a chemical matrixectomy to the tibiofibular border of the hallux right today after local anesthesia was administered. She tolerated procedure well without complications. She was provided with both oral home-going instructions for care and soaking of her foot. I also encouraged her to continue the use of her orthotics. I prescribed her Cortisporin Otic to be applied twice daily after soaking as well as Celebrex 200 mg one B by mouth twice a day as well as gabapentin to start at nighttime. I will follow-up with her in 1-2 weeks.

## 2016-02-08 NOTE — Patient Instructions (Signed)

## 2016-02-22 ENCOUNTER — Ambulatory Visit: Payer: Managed Care, Other (non HMO) | Admitting: Podiatry

## 2016-02-29 ENCOUNTER — Ambulatory Visit (INDEPENDENT_AMBULATORY_CARE_PROVIDER_SITE_OTHER): Payer: Self-pay | Admitting: Podiatry

## 2016-02-29 DIAGNOSIS — M779 Enthesopathy, unspecified: Secondary | ICD-10-CM

## 2016-02-29 DIAGNOSIS — M7750 Other enthesopathy of unspecified foot: Secondary | ICD-10-CM

## 2016-02-29 DIAGNOSIS — M778 Other enthesopathies, not elsewhere classified: Secondary | ICD-10-CM

## 2016-02-29 DIAGNOSIS — L600 Ingrowing nail: Secondary | ICD-10-CM

## 2016-02-29 NOTE — Progress Notes (Signed)
She presents today for follow-up of matrixectomy tibial border hallux right she states that the heels are doing much better but her left one was really killing her night before last. She states this seems to be doing better now.  Objective: Vital signs are stable she is alert and oriented 3 still has some tenderness on palpation between ORIF and fifth metatarsals of the left foot proximally the hallux nail right appears to be healing uneventfully no signs of infection.  Assessment: Well-healing surgical foot hallux right. Capsulitis secondary to plantar fasciitis bilateral. Left foot is painful between the fourth and fifth metatarsals the bilateral heels are getting better.  Plan: I offered her an injection today she declined she will follow up with me in the near future she will continue to take Celebrex regularly.

## 2016-02-29 NOTE — Patient Instructions (Signed)

## 2016-03-28 ENCOUNTER — Ambulatory Visit (INDEPENDENT_AMBULATORY_CARE_PROVIDER_SITE_OTHER): Payer: Managed Care, Other (non HMO) | Admitting: Podiatry

## 2016-03-28 ENCOUNTER — Encounter: Payer: Self-pay | Admitting: Podiatry

## 2016-03-28 DIAGNOSIS — M779 Enthesopathy, unspecified: Principal | ICD-10-CM

## 2016-03-28 DIAGNOSIS — M722 Plantar fascial fibromatosis: Secondary | ICD-10-CM | POA: Diagnosis not present

## 2016-03-28 DIAGNOSIS — M7750 Other enthesopathy of unspecified foot: Secondary | ICD-10-CM

## 2016-03-28 DIAGNOSIS — M778 Other enthesopathies, not elsewhere classified: Secondary | ICD-10-CM

## 2016-03-28 NOTE — Progress Notes (Signed)
She presents today for follow-up of her capsulitis and plantar fasciitis bilaterally. She states it is related to an bad in the left foot. The right physical much better.  Objective: Vital signs are stable alert and oriented 3. Palpation medial calcaneal tubercle of the left heel.  Assessment: Plantar fascitis left.  Plan: Injected left heel today with a long local anesthetic. Follow-up with her in 4 months.

## 2016-05-02 ENCOUNTER — Ambulatory Visit: Payer: Managed Care, Other (non HMO) | Admitting: Podiatry

## 2016-09-27 ENCOUNTER — Telehealth: Payer: Self-pay

## 2016-09-27 ENCOUNTER — Other Ambulatory Visit: Payer: Self-pay | Admitting: Family Medicine

## 2016-09-27 DIAGNOSIS — M7989 Other specified soft tissue disorders: Secondary | ICD-10-CM

## 2016-09-27 NOTE — Telephone Encounter (Signed)
SENT NOTE TO SCHEDULING 

## 2016-10-03 ENCOUNTER — Ambulatory Visit
Admission: RE | Admit: 2016-10-03 | Discharge: 2016-10-03 | Disposition: A | Discharge status: Home / Self Care | Payer: Managed Care, Other (non HMO) | Source: Ambulatory Visit | Attending: Family Medicine | Admitting: Family Medicine

## 2016-10-03 DIAGNOSIS — M7989 Other specified soft tissue disorders: Secondary | ICD-10-CM

## 2016-10-07 ENCOUNTER — Encounter: Payer: Self-pay | Admitting: Cardiology

## 2016-10-07 ENCOUNTER — Ambulatory Visit (INDEPENDENT_AMBULATORY_CARE_PROVIDER_SITE_OTHER): Payer: Managed Care, Other (non HMO) | Admitting: Cardiology

## 2016-10-07 VITALS — BP 130/76 | HR 99 | Ht 64.0 in | Wt 237.0 lb

## 2016-10-07 DIAGNOSIS — I7090 Unspecified atherosclerosis: Secondary | ICD-10-CM | POA: Insufficient documentation

## 2016-10-07 DIAGNOSIS — Z87891 Personal history of nicotine dependence: Secondary | ICD-10-CM | POA: Insufficient documentation

## 2016-10-07 DIAGNOSIS — R600 Localized edema: Secondary | ICD-10-CM | POA: Diagnosis not present

## 2016-10-07 DIAGNOSIS — E7800 Pure hypercholesterolemia, unspecified: Secondary | ICD-10-CM

## 2016-10-07 DIAGNOSIS — E663 Overweight: Secondary | ICD-10-CM | POA: Insufficient documentation

## 2016-10-07 DIAGNOSIS — I10 Essential (primary) hypertension: Secondary | ICD-10-CM | POA: Diagnosis not present

## 2016-10-07 MED ORDER — ASPIRIN EC 325 MG PO TBEC: 325.0000 mg | DELAYED_RELEASE_TABLET | Freq: Every day | ORAL | 3 refills | Status: DC

## 2016-10-07 NOTE — Patient Instructions (Addendum)
Medication Instructions:  Your physician recommends that you continue on your current medications as directed. Please refer to the Current Medication list given to you today.  START: Aspirin 325 mg once daily.  Labwork: None  Testing/Procedures: Your physician has requested that you have an echocardiogram. Echocardiography is a painless test that uses sound waves to create images of your heart. It provides your doctor with information about the size and shape of your heart and how well your heart's chambers and valves are working. This procedure takes approximately one hour. There are no restrictions for this procedure.  Your physician has requested that you have a lexiscan myoview. For further information please visit [email or web address] Please follow instruction sheet, as given.    Follow-Up: Your physician recommends that you schedule a follow-up appointment in: 3 months with Dr. Tomie China  Any Other Special Instructions Will Be Listed Below (If Applicable).     If you need a refill on your cardiac medications before your next appointment, please call your pharmacy.

## 2016-10-07 NOTE — Progress Notes (Signed)
Cardiology Office Note:    Date:  10/07/2016   ID:  Renee Stevens, DOB [birth year], MRN [id number]  PCP:  Renee Pollack, MD  Cardiologist:  Renee Brothers, MD   Referring MD: Renee Pollack, MD    ASSESSMENT:    1. Atherosclerotic vascular disease   2. Edema, lower extremity   3. Essential hypertension   4. Hypercholesteremia   5. Pedal edema   6. Overweight   7. Ex-smoker    PLAN:    In order of problems listed above:  1. Secondary prevention stressed to the patient. Importance of compliance with diet and medications stressed. Diet was discussed with dyslipidemia and diabetes mellitus and weight reduction was stressed and she verbalized understanding. She has been diagnosed with peripheral vascular disease and has been recommended CT angiography of the circulation to the lower extremities for further evaluation. In view of this I also asked her to take aspirin 325 mg coated on a daily basis. 2. In view of multiple risk factors he will undergo Lexiscan sestamibi testing. Echocardiogram will be done to assess murmur heard on auscultation. She needs aggressive secondary prevention and lipid lowering. I discussed this with her at length. 3. Her blood pressure is stable and we will follow this closely. 4. She will be seen in follow-up appointment in 3 months or earlier if she has any concerns.   Medication Adjustments/Labs and Tests Ordered: Current medicines are reviewed at length with the patient today.  Concerns regarding medicines are outlined above.  No orders of the defined types were placed in this encounter.  No orders of the defined types were placed in this encounter.    History of Present Illness:    Renee Stevens is a 46 y.o. female who is being seen today for the evaluation of Atherosclerotic vascular disease and pedal edema at the request of Renee Pollack, MD. Patient is a pleasant 47 year old female. She has past medical history of essential hypertension,  dyslipidemia, diabetes mellitus. She is referred here for pedal edema. She has been referred for this purpose however she complains of symptoms suggesting of intermittent claudication. She mentions to me that this has affected her quality of life. She is worried about the symptoms. At the time of my evaluation she is alert awake oriented and in no distress. She has no history of an evaluation of his cardiac problem in the past.  Past Medical History:  Diagnosis Date  . Aneurysm (HCC)   . Anxiety   . Diabetes mellitus without complication (HCC)   . Hidradenitis   . Hyperlipidemia   . Hypertension   . Sleep apnea   . Vitamin D deficiency     Past Surgical History:  Procedure Laterality Date  . LEEP     1990 AND 2008    Current Medications: Current Meds  Medication Sig  . atenolol (TENORMIN) 50 MG tablet Take 50 mg by mouth daily.  Marland Kitchen atorvastatin (LIPITOR) 80 MG tablet Take 80 mg by mouth daily.  Marland Kitchen glimepiride (AMARYL) 4 MG tablet Take 4 mg by mouth daily with breakfast.  . glucose blood test strip 1 each by Other route as needed for other. Use as instructed  . ibuprofen (ADVIL,MOTRIN) 200 MG tablet Take 200 mg by mouth every 6 (six) hours as needed.  Marland Kitchen lisinopril-hydrochlorothiazide (PRINZIDE,ZESTORETIC) 10-12.5 MG per tablet Take 1 tablet by mouth daily.  . medroxyPROGESTERone (DEPO-PROVERA) 150 MG/ML injection Inject 150 mg into the muscle every 3 (three) months.  . metFORMIN (GLUCOPHAGE)  500 MG tablet Take 1,000 mg by mouth 2 (two) times daily with a meal.   . pioglitazone (ACTOS) 15 MG tablet Take 15 mg by mouth daily.  . Vitamin D, Ergocalciferol, (DRISDOL) 50000 UNITS CAPS capsule Take 50,000 Units by mouth every 7 (seven) days. Twice Weekly     Allergies:   Patient has no known allergies.   Social History   Social History  . Marital status: Married    Spouse name: N/A  . Number of children: N/A  . Years of education: N/A   Social History Main Topics  . Smoking  status: Former Smoker    Quit date: 02/02/2012  . Smokeless tobacco: Never Used  . Alcohol use No  . Drug use: No  . Sexual activity: Yes    Partners: Male   Other Topics Concern  . None   Social History Narrative  . None     Family History: The patient's family history includes Aneurysm in her sister; Hypertension in her mother; Stroke in her maternal grandmother.  ROS:   Please see the history of present illness.    All other systems reviewed and are negative.  EKGs/Labs/Other Studies Reviewed:    The following studies were reviewed today: I reviewed records sent from primary care physician's office at extensive length and patient had multiple questions which were answered to her satisfaction.   Recent Labs: No results found for requested labs within last 8760 hours.  Recent Lipid Panel No results found for: CHOL, TRIG, HDL, CHOLHDL, VLDL, LDLCALC, LDLDIRECT  Physical Exam:    VS:  BP 130/76   Pulse 99   Ht 5\' 4"  (1.626 m)   Wt 237 lb 0.6 oz (107.5 kg)   SpO2 98%   BMI 40.69 kg/m     Wt Readings from Last 3 Encounters:  10/07/16 237 lb 0.6 oz (107.5 kg)  09/09/14 226 lb 9.6 oz (102.8 kg)  04/19/14 232 lb 4.8 oz (105.4 kg)     GEN: Patient is in no acute distress HEENT: Normal NECK: No JVD; No carotid bruits LYMPHATICS: No lymphadenopathy CARDIAC: S1 S2 regular, 2/6 systolic murmur at the apex. RESPIRATORY:  Clear to auscultation without rales, wheezing or rhonchi  ABDOMEN: Soft, non-tender, non-distended MUSCULOSKELETAL:  No edema; No deformity  SKIN: Warm and dry NEUROLOGIC:  Alert and oriented x 3 PSYCHIATRIC:  Normal affect    Signed, Renee Brothers, MD  10/07/2016 4:39 PM    [facility]

## 2016-10-17 ENCOUNTER — Other Ambulatory Visit: Payer: Self-pay | Admitting: Family Medicine

## 2016-10-17 DIAGNOSIS — I739 Peripheral vascular disease, unspecified: Secondary | ICD-10-CM

## 2016-10-18 ENCOUNTER — Ambulatory Visit
Admission: RE | Admit: 2016-10-18 | Discharge: 2016-10-18 | Disposition: A | Discharge status: Home / Self Care | Payer: Managed Care, Other (non HMO) | Source: Ambulatory Visit | Attending: Family Medicine | Admitting: Family Medicine

## 2016-10-18 DIAGNOSIS — I739 Peripheral vascular disease, unspecified: Secondary | ICD-10-CM

## 2016-10-18 MED ORDER — IOPAMIDOL (ISOVUE-300) INJECTION 61%
125.0000 mL | Freq: Once | INTRAVENOUS | Status: AC | PRN
Start: 2016-10-18 — End: 2016-10-18
  Administered 2016-10-18: 125 mL via INTRAVENOUS

## 2016-10-28 ENCOUNTER — Ambulatory Visit (HOSPITAL_COMMUNITY): Payer: Managed Care, Other (non HMO)

## 2016-10-29 ENCOUNTER — Ambulatory Visit (HOSPITAL_COMMUNITY): Payer: Managed Care, Other (non HMO)

## 2016-11-04 ENCOUNTER — Telehealth (HOSPITAL_COMMUNITY): Payer: Self-pay | Admitting: *Deleted

## 2016-11-04 NOTE — Telephone Encounter (Signed)
Left message on voicemail per DPR in reference to upcoming appointment scheduled on 11/06/16 at 1230 with detailed instructions given per Myocardial Perfusion Study Information Sheet for the test. LM to arrive 15 minutes early, and that it is imperative to arrive on time for appointment to keep from having the test rescheduled. If you need to cancel or reschedule your appointment, please call the office within 24 hours of your appointment. Failure to do so may result in a cancellation of your appointment, and a $50 no show fee. Phone number given for call back for any questions.

## 2016-11-06 ENCOUNTER — Ambulatory Visit (HOSPITAL_COMMUNITY): Payer: Managed Care, Other (non HMO) | Attending: Cardiovascular Disease

## 2016-11-06 ENCOUNTER — Other Ambulatory Visit: Payer: Self-pay

## 2016-11-06 ENCOUNTER — Ambulatory Visit (HOSPITAL_BASED_OUTPATIENT_CLINIC_OR_DEPARTMENT_OTHER): Payer: Managed Care, Other (non HMO)

## 2016-11-06 DIAGNOSIS — E7800 Pure hypercholesterolemia, unspecified: Secondary | ICD-10-CM

## 2016-11-06 DIAGNOSIS — I071 Rheumatic tricuspid insufficiency: Secondary | ICD-10-CM | POA: Insufficient documentation

## 2016-11-06 DIAGNOSIS — G4730 Sleep apnea, unspecified: Secondary | ICD-10-CM | POA: Diagnosis not present

## 2016-11-06 DIAGNOSIS — Z87891 Personal history of nicotine dependence: Secondary | ICD-10-CM

## 2016-11-06 DIAGNOSIS — R600 Localized edema: Secondary | ICD-10-CM

## 2016-11-06 DIAGNOSIS — I7090 Unspecified atherosclerosis: Secondary | ICD-10-CM | POA: Diagnosis not present

## 2016-11-06 DIAGNOSIS — E663 Overweight: Secondary | ICD-10-CM | POA: Diagnosis not present

## 2016-11-06 DIAGNOSIS — Z6841 Body Mass Index (BMI) 40.0 and over, adult: Secondary | ICD-10-CM | POA: Diagnosis not present

## 2016-11-06 DIAGNOSIS — I10 Essential (primary) hypertension: Secondary | ICD-10-CM | POA: Insufficient documentation

## 2016-11-06 MED ORDER — REGADENOSON 0.4 MG/5ML IV SOLN
0.4000 mg | Freq: Once | INTRAVENOUS | Status: AC
  Administered 2016-11-06: 0.4 mg via INTRAVENOUS

## 2016-11-06 MED ORDER — TECHNETIUM TC 99M TETROFOSMIN IV KIT
33.0000 | PACK | Freq: Once | INTRAVENOUS | Status: AC | PRN
  Administered 2016-11-06: 33 via INTRAVENOUS
  Filled 2016-11-06: qty 33

## 2016-11-07 ENCOUNTER — Ambulatory Visit (HOSPITAL_COMMUNITY): Payer: Managed Care, Other (non HMO) | Attending: Cardiology

## 2016-11-07 LAB — MYOCARDIAL PERFUSION IMAGING
LV dias vol: 78 mL (ref 46–106)
LVSYSVOL: 23 mL
Peak HR: 116 {beats}/min
RATE: 0.51
Rest HR: 72 {beats}/min
SDS: 3
SRS: 1
SSS: 4
TID: 1

## 2016-11-07 MED ORDER — TECHNETIUM TC 99M TETROFOSMIN IV KIT
31.0000 | PACK | Freq: Once | INTRAVENOUS | Status: AC | PRN
  Administered 2016-11-07: 31 via INTRAVENOUS
  Filled 2016-11-07: qty 31

## 2017-01-06 ENCOUNTER — Ambulatory Visit: Payer: Managed Care, Other (non HMO) | Admitting: Cardiology

## 2017-01-06 ENCOUNTER — Encounter: Payer: Self-pay | Admitting: Cardiology

## 2017-01-06 VITALS — BP 116/72 | HR 92 | Ht 64.0 in | Wt 231.1 lb

## 2017-01-06 DIAGNOSIS — I7090 Unspecified atherosclerosis: Secondary | ICD-10-CM

## 2017-01-06 DIAGNOSIS — R600 Localized edema: Secondary | ICD-10-CM | POA: Diagnosis not present

## 2017-01-06 DIAGNOSIS — I10 Essential (primary) hypertension: Secondary | ICD-10-CM

## 2017-01-06 NOTE — Progress Notes (Signed)
Cardiology Office Note:    Date:  01/06/2017   ID:  Renee Stevens, DOB 28-Aug-1970, MRN [id number]  PCP:  Renee Stevens, Donna, Renee Stevens  Cardiologist:  Renee Brothersajan R Rosalio Catterton, Renee Stevens   Referring Renee Stevens: Renee Stevens, Donna, Renee Stevens    ASSESSMENT:    1. Atherosclerotic vascular disease   2. Essential hypertension   3. Pedal edema    PLAN:    In order of problems listed above:  1. Secondary prevention stressed with the patient.  Importance of compliance with diet and medications stressed and she vocalized understanding. 2. Results of echocardiogram and stress test dated. 3. I noticed that she has atherosclerotic evidence on peripheral angiography and a CT scan and therefore secondary prevention stressed. 4. Diet was discussed with dyslipidemia and diabetes mellitus.  Weight reduction was stressed.  Risks of obesity explained. 5. Patient will be seen in follow-up appointment in 6 months or earlier if the patient has any concerns    Medication Adjustments/Labs and Tests Ordered: Current medicines are reviewed at length with the patient today.  Concerns regarding medicines are outlined above.  No orders of the defined types were placed in this encounter.  No orders of the defined types were placed in this encounter.    Chief Complaint  Patient presents with  . Follow-up  . Edema    have been doing C Pap and wearing Compression socks.      History of Present Illness:    Renee Stevens is a 46 y.o. female patient.  The patient was evaluated for pedal edema.  She has multiple risk factors for coronary artery disease and atherosclerotic vascular disease.  She underwent echocardiographic testing and stress testing which were unremarkable.  Past Medical History:  Diagnosis Date  . Aneurysm (HCC)   . Anxiety   . Diabetes mellitus without complication (HCC)   . Hidradenitis   . Hyperlipidemia   . Hypertension   . Sleep apnea   . Vitamin D deficiency     Past Surgical History:  Procedure Laterality Date    . LEEP     1990 AND 2008    Current Medications: Current Meds  Medication Sig  . aspirin EC 325 MG tablet Take 1 tablet (325 mg total) by mouth daily.  Marland Kitchen. atenolol (TENORMIN) 50 MG tablet Take 50 mg by mouth daily.  Marland Kitchen. atorvastatin (LIPITOR) 80 MG tablet Take 80 mg by mouth daily.  Marland Kitchen. glimepiride (AMARYL) 4 MG tablet Take 4 mg by mouth daily with breakfast.  . glucose blood test strip 1 each by Other route as needed for other. Use as instructed  . ibuprofen (ADVIL,MOTRIN) 200 MG tablet Take 200 mg by mouth every 6 (six) hours as needed.  Marland Kitchen. lisinopril-hydrochlorothiazide (PRINZIDE,ZESTORETIC) 10-12.5 MG per tablet Take 1 tablet by mouth daily.  . medroxyPROGESTERone (DEPO-PROVERA) 150 MG/ML injection Inject 150 mg into the muscle every 3 (three) months.  . metFORMIN (GLUCOPHAGE) 500 MG tablet Take 1,000 mg by mouth 2 (two) times daily with a meal.   . pioglitazone (ACTOS) 15 MG tablet Take 15 mg by mouth daily.  . Vitamin D, Ergocalciferol, (DRISDOL) 50000 UNITS CAPS capsule Take 50,000 Units by mouth every 7 (seven) days. Twice Weekly     Allergies:   Patient has no known allergies.   Social History   Socioeconomic History  . Marital status: Married    Spouse name: None  . Number of children: None  . Years of education: None  . Highest education level: None  Social Needs  .  Financial resource strain: None  . Food insecurity - worry: None  . Food insecurity - inability: None  . Transportation needs - medical: None  . Transportation needs - non-medical: None  Occupational History  . None  Tobacco Use  . Smoking status: Former Smoker    Last attempt to quit: 02/02/2012    Years since quitting: 4.9  . Smokeless tobacco: Never Used  Substance and Sexual Activity  . Alcohol use: No    Alcohol/week: 0.0 oz  . Drug use: No  . Sexual activity: Yes    Partners: Male  Other Topics Concern  . None  Social History Narrative  . None     Family History: The patient's family  history includes Aneurysm in her sister; Hypertension in her mother; Stroke in her maternal grandmother.  ROS:   Please see the history of present illness.    All other systems reviewed and are negative.  EKGs/Labs/Other Studies Reviewed:    The following studies were reviewed today: I discussed the findings of the echocardiogram and stress test report with the patient had extensive length.  Questions were answered to her satisfaction.   Recent Labs: No results found for requested labs within last 8760 hours.  Recent Lipid Panel No results found for: CHOL, TRIG, HDL, CHOLHDL, VLDL, LDLCALC, LDLDIRECT  Physical Exam:    VS:  BP 116/72   Pulse 92   Ht 5\' 4"  (1.626 m)   Wt 231 lb 1.3 oz (104.8 kg)   SpO2 98%   BMI 39.66 kg/m     Wt Readings from Last 3 Encounters:  01/06/17 231 lb 1.3 oz (104.8 kg)  11/06/16 237 lb (107.5 kg)  10/07/16 237 lb 0.6 oz (107.5 kg)     GEN: Patient is in no acute distress HEENT: Normal NECK: No JVD; No carotid bruits LYMPHATICS: No lymphadenopathy CARDIAC: Hear sounds regular, 2/6 systolic murmur at the apex. RESPIRATORY:  Clear to auscultation without rales, wheezing or rhonchi  ABDOMEN: Soft, non-tender, non-distended MUSCULOSKELETAL:  No edema; No deformity  SKIN: Warm and dry NEUROLOGIC:  Alert and oriented x 3 PSYCHIATRIC:  Normal affect   Signed, Renee Brothersajan R Mason Burleigh, Renee Stevens  01/06/2017 12:36 PM    [facility]

## 2017-01-06 NOTE — Patient Instructions (Signed)
Medication Instructions:  Your physician recommends that you continue on your current medications as directed. Please refer to the Current Medication list given to you today.   Labwork: NONE  Testing/Procedures: NONE  Follow-Up: Your physician recommends that you schedule a follow-up appointment in: AS NEEDED      Any Other Special Instructions Will Be Listed Below (If Applicable).     If you need a refill on your cardiac medications before your next appointment, please call your pharmacy.   

## 2017-01-19 ENCOUNTER — Emergency Department (HOSPITAL_BASED_OUTPATIENT_CLINIC_OR_DEPARTMENT_OTHER)
Admission: EM | Admit: 2017-01-19 | Discharge: 2017-01-20 | Disposition: A | Discharge status: Home / Self Care | Payer: Managed Care, Other (non HMO) | Attending: Emergency Medicine | Admitting: Emergency Medicine

## 2017-01-19 ENCOUNTER — Other Ambulatory Visit: Payer: Self-pay

## 2017-01-19 ENCOUNTER — Encounter (HOSPITAL_BASED_OUTPATIENT_CLINIC_OR_DEPARTMENT_OTHER): Payer: Self-pay | Admitting: Emergency Medicine

## 2017-01-19 DIAGNOSIS — M545 Low back pain, unspecified: Secondary | ICD-10-CM

## 2017-01-19 DIAGNOSIS — Z87891 Personal history of nicotine dependence: Secondary | ICD-10-CM | POA: Diagnosis not present

## 2017-01-19 DIAGNOSIS — E119 Type 2 diabetes mellitus without complications: Secondary | ICD-10-CM | POA: Insufficient documentation

## 2017-01-19 DIAGNOSIS — Z79899 Other long term (current) drug therapy: Secondary | ICD-10-CM | POA: Insufficient documentation

## 2017-01-19 DIAGNOSIS — Z7984 Long term (current) use of oral hypoglycemic drugs: Secondary | ICD-10-CM | POA: Diagnosis not present

## 2017-01-19 DIAGNOSIS — I10 Essential (primary) hypertension: Secondary | ICD-10-CM | POA: Diagnosis not present

## 2017-01-19 DIAGNOSIS — Z7982 Long term (current) use of aspirin: Secondary | ICD-10-CM | POA: Insufficient documentation

## 2017-01-19 LAB — URINALYSIS, ROUTINE W REFLEX MICROSCOPIC
Bilirubin Urine: NEGATIVE
HGB URINE DIPSTICK: NEGATIVE
KETONES UR: 15 mg/dL — AB
LEUKOCYTES UA: NEGATIVE
Nitrite: NEGATIVE
PROTEIN: NEGATIVE mg/dL
Specific Gravity, Urine: >1.03 — ABNORMAL HIGH (ref 1.005–1.030)
pH: 5.5 (ref 5.0–8.0)

## 2017-01-19 LAB — PREGNANCY, URINE: PREG TEST UR: NEGATIVE

## 2017-01-19 LAB — URINALYSIS, MICROSCOPIC (REFLEX)

## 2017-01-19 MED ORDER — METHOCARBAMOL 500 MG PO TABS: 500.0000 mg | ORAL_TABLET | Freq: Two times a day (BID) | ORAL | 0 refills | Status: DC

## 2017-01-19 MED ORDER — PREDNISONE 20 MG PO TABS
20.0000 mg | ORAL_TABLET | Freq: Once | ORAL | Status: AC
  Administered 2017-01-19: 20 mg via ORAL
  Filled 2017-01-19: qty 1

## 2017-01-19 MED ORDER — PREDNISONE 10 MG PO TABS: 20.0000 mg | ORAL_TABLET | Freq: Every day | ORAL | 0 refills | Status: DC

## 2017-01-19 MED ORDER — KETOROLAC TROMETHAMINE 30 MG/ML IJ SOLN
30.0000 mg | Freq: Once | INTRAMUSCULAR | Status: AC
  Administered 2017-01-19: 30 mg via INTRAMUSCULAR
  Filled 2017-01-19: qty 1

## 2017-01-19 MED ORDER — ACETAMINOPHEN 325 MG PO TABS
650.0000 mg | ORAL_TABLET | Freq: Once | ORAL | Status: AC
  Administered 2017-01-19: 650 mg via ORAL
  Filled 2017-01-19: qty 2

## 2017-01-19 MED ORDER — DIAZEPAM 5 MG PO TABS
5.0000 mg | ORAL_TABLET | Freq: Once | ORAL | Status: AC
Start: 2017-01-19 — End: 2017-01-19
  Administered 2017-01-19: 5 mg via ORAL
  Filled 2017-01-19: qty 1

## 2017-01-19 MED ORDER — ONDANSETRON 8 MG PO TBDP
8.0000 mg | ORAL_TABLET | Freq: Once | ORAL | Status: AC
  Administered 2017-01-19: 8 mg via ORAL
  Filled 2017-01-19: qty 1

## 2017-01-19 MED ORDER — PREDNISONE 10 MG PO TABS: 20.0000 mg | ORAL_TABLET | Freq: Every day | ORAL | 0 refills | Status: AC

## 2017-01-19 MED ORDER — OXYCODONE HCL 5 MG PO TABS
5.0000 mg | ORAL_TABLET | Freq: Once | ORAL | Status: AC
  Administered 2017-01-19: 5 mg via ORAL
  Filled 2017-01-19: qty 1

## 2017-01-19 MED ORDER — ONDANSETRON HCL 4 MG PO TABS: 4.0000 mg | ORAL_TABLET | Freq: Four times a day (QID) | ORAL | 0 refills | Status: DC

## 2017-01-19 MED ORDER — OXYCODONE-ACETAMINOPHEN 5-325 MG PO TABS: 1.0000 | ORAL_TABLET | Freq: Once | ORAL | Status: DC

## 2017-01-19 NOTE — ED Notes (Signed)
ED Provider at bedside. 

## 2017-01-19 NOTE — ED Provider Notes (Signed)
[facility] EMERGENCY DEPARTMENT Provider Note   CSN: [id number] Arrival date & time: 01/19/17  1625     History   Chief Complaint Chief Complaint  Patient presents with  . Back Pain    HPI Renee Stevens is a 46 y.o. female.  HPI   Patient is a 46 year old female with a history of diabetes mellitus, anxiety, hypertension, hyperlipidemia presenting for acute low back pain after bending down earlier today.  Patient reports that her pain was directly linked to this bending down episode and she has felt pain in a bandlike pattern across her lower back ever since.  Patient denies any fever or chills.  Patient denies any abdominal pain or flank pain.  No saddle anesthesia, numbness in the lower extremities, weakness in the lower extremities, loss of bowel or bladder control.  Patient is not an IV drug user.  No history of recent spinal procedures.  Patient reports that a similar episode happened to her many years ago, however she has not had any recent problems with her back.  Patient works at [employer] and is on her feet frequently for her job.  Past Medical History:  Diagnosis Date  . Aneurysm (HCC)   . Anxiety   . Diabetes mellitus without complication (HCC)   . Hidradenitis   . Hyperlipidemia   . Hypertension   . Sleep apnea   . Vitamin D deficiency     Patient Active Problem List   Diagnosis Date Noted  . Edema, lower extremity 10/07/2016  . Atherosclerotic vascular disease 10/07/2016  . Pedal edema 10/07/2016  . Overweight 10/07/2016  . Ex-smoker 10/07/2016  . Intractable chronic migraine without aura and without status migrainosus 09/09/2014  . Hypercholesteremia 05/22/2013  . Vitamin D deficiency 05/22/2013  . DM type 2 (diabetes mellitus, type 2) (HCC) 05/22/2013  . Essential hypertension 05/22/2013    Past Surgical History:  Procedure Laterality Date  . LEEP     1990 AND 2008    OB History    No data available       Home Medications     Prior to Admission medications   Medication Sig Start Date End Date Taking? Authorizing Provider  aspirin EC 325 MG tablet Take 1 tablet (325 mg total) by mouth daily. 10/07/16   Revankar, Aundra Dubin, MD  atenolol (TENORMIN) 50 MG tablet Take 50 mg by mouth daily.    [provider]  atorvastatin (LIPITOR) 80 MG tablet Take 80 mg by mouth daily.    [provider]  glimepiride (AMARYL) 4 MG tablet Take 4 mg by mouth daily with breakfast.    [provider]  glucose blood test strip 1 each by Other route as needed for other. Use as instructed    [provider]  ibuprofen (ADVIL,MOTRIN) 200 MG tablet Take 200 mg by mouth every 6 (six) hours as needed.    [provider]  lisinopril-hydrochlorothiazide (PRINZIDE,ZESTORETIC) 10-12.5 MG per tablet Take 1 tablet by mouth daily.    [provider]  medroxyPROGESTERone (DEPO-PROVERA) 150 MG/ML injection Inject 150 mg into the muscle every 3 (three) months.    [provider]  metFORMIN (GLUCOPHAGE) 500 MG tablet Take 1,000 mg by mouth 2 (two) times daily with a meal.     [provider]  pioglitazone (ACTOS) 15 MG tablet Take 15 mg by mouth daily.    [provider]  Vitamin D, Ergocalciferol, (DRISDOL) 50000 UNITS CAPS capsule Take 50,000 Units by mouth every  7 (seven) days. Twice Weekly    [provider]    Family History Family History  Problem Relation Age of Onset  . Hypertension Mother   . Aneurysm Sister   . Stroke Maternal Grandmother     Social History Social History   Tobacco Use  . Smoking status: Former Smoker    Last attempt to quit: 02/02/2012    Years since quitting: 4.9  . Smokeless tobacco: Never Used  Substance Use Topics  . Alcohol use: No    Alcohol/week: 0.0 oz  . Drug use: No     Allergies   Patient has no known allergies.   Review of Systems Review of Systems  Constitutional: Negative for chills and fever.   Gastrointestinal: Negative for abdominal pain.  Genitourinary: Negative for dysuria and flank pain.  Musculoskeletal: Positive for back pain.  Neurological: Negative for weakness and numbness.     Physical Exam Updated Vital Signs BP 111/69 (BP Location: Right Arm)   Pulse 93   Temp 98.3 F (36.8 C) (Oral)   Resp 20   Ht 5\' 4"  (1.626 m)   Wt 104.3 kg (230 lb)   SpO2 96%   BMI 39.48 kg/m   Physical Exam  Constitutional: She appears well-developed and well-nourished. No distress.  Sitting comfortably in bed.  HENT:  Head: Normocephalic and atraumatic.  Eyes: Conjunctivae are normal. Right eye exhibits no discharge. Left eye exhibits no discharge.  EOMs normal to gross examination.  Neck: Normal range of motion.  Cardiovascular: Normal rate and regular rhythm.  Intact, 2+ DP pulses bilaterally.  Pulmonary/Chest:  Normal respiratory effort. Patient converses comfortably. No audible wheeze or stridor.  Abdominal: Soft. She exhibits no distension. There is no tenderness. There is no guarding.  Musculoskeletal: Normal range of motion.  Spine Exam: Inspection/Palpation: No midline tenderness palpation of cervical or thoracic spine.  There is tenderness to palpation across the entire lumbar spine without focal area of tenderness. Strength: 5/5 throughout LE bilaterally (hip flexion/extension, adduction/abduction; knee flexion/extension; foot dorsiflexion/plantarflexion, inversion/eversion; great toe inversion) Sensation: Intact to light touch in proximal and distal LE bilaterally Reflexes: 2+ quadriceps and achilles reflexes  Neurological: She is alert.  Cranial nerves intact to gross observation. Patient moves extremities symmetrically.  Skin: Skin is warm and dry. She is not diaphoretic.  Psychiatric: She has a normal mood and affect. Her behavior is normal. Judgment and thought content normal.  Nursing note and vitals reviewed.    ED Treatments / Results  Labs (all labs  ordered are listed, but only abnormal results are displayed) Labs Reviewed  PREGNANCY, URINE  URINALYSIS, ROUTINE W REFLEX MICROSCOPIC    EKG  EKG Interpretation None       Radiology No results found.  Procedures Procedures (including critical care time)  Medications Ordered in ED Medications  ketorolac (TORADOL) 30 MG/ML injection 30 mg (30 mg Intramuscular Given 01/19/17 2056)  diazepam (VALIUM) tablet 5 mg (5 mg Oral Given 01/19/17 2055)     Initial Impression / Assessment and Plan / ED Course  I have reviewed the triage vital signs and the nursing notes.  Pertinent labs & imaging results that were available during my care of the patient were reviewed by me and considered in my medical decision making (see chart for details).     Final Clinical Impressions(s) / ED Diagnoses   Final diagnoses:  None   Patient denies any concerning symptoms suggestive of cauda equina requiring urgent imaging at this time such as loss  of sensation in the lower extremities, lower extremity weakness, loss of bowel or bladder control, saddle anesthesia, urinary retention, fever/chills, IVDU. Exam demonstrated no  weakness on exam today. No preceding injury or trauma to suggest acute fracture. The mechanism of injury is not suggestive of acute bony pathology warranting imaging at this time. Patient exhibited acute muscular tenderness that required multiple analgesic agents to assess ambulation. Doubt pelvic or urinary pathology for patient's acute back pain, as patient denies urinary symptoms, has no evidence of infection on UA, has no CVA tenderness, history/pain not consistent with nephrolithiasis, has no vaginal discharge, and is not pregnant as evidenced by negative POC urine pregnancy test. Patient to be treated symptomatically with short course of steroids and Robaxin. I also discussed with patient that she requires PCP follow up for the sugar noted in her urine. I counseled on monitoring blood  sugar closley while on low-dose steroids. Patient given strict return precautions for any symptoms indicating worsening neurologic function in the lower extremities.  Nursing notes reviewed. Vital signs reviewed. All questions answered by patient and family.  ED Discharge Orders    None       Delia ChimesMurray, Saber Dickerman B, PA-C 01/20/17 0405    Benjiman CorePickering, Nathan, MD 01/20/17 1807

## 2017-01-19 NOTE — ED Notes (Signed)
Called to ED13 because pt was c/o nausea. Pt was pale and diaphoretic. Assisted to stretcher. VS obtained. Cool compresses applied to forehead and back of neck. Given Zofran. After a few minutes, pt stated that she felt better. Color improved. Given sips of Sprite and ice chips.

## 2017-01-19 NOTE — Discharge Instructions (Addendum)
Please see the information and instructions below regarding your visit.  Your diagnoses today include:  1. Acute bilateral low back pain without sciatica    About diagnosis. Most episodes of acute low back pain are self-limited. Your exam was reassuring today that the source of your pain is not affecting the spinal cord and nerves that originate in the spinal cord.   If you have a history of disc herniation or arthritis in your spine, the nerves exiting the spine on one side get inflamed. This can cause severe pain. We call this radiculopathy. We do not always know what causes the sudden inflammation.  Tests performed today include: See side panel of your discharge paperwork for testing performed today. Vital signs are listed at the bottom of these instructions.   Medications prescribed:    Take any prescribed medications only as prescribed, and any over the counter medications only as directed on the packaging.  You are prescribed Robaxin, a muscle relaxant. Some common side effects of this medication include:  Feeling sleepy.  Dizziness. Take care upon going from a seated to a standing position.  Dry mouth.  Feeling tired or weak.  Hard stools (constipation).  Upset stomach. These are not all of the side effects that may occur. If you have questions about side effects, call your doctor. Call your primary care provider for medical advice about side effects.  This medication can be sedating. Only take this medication as needed. Please do not combine with alcohol. Do not drive or operate machinery while taking this medication.   This medication can interact with some other medications. Make sure to tell any provider you are taking this medication before they prescribe you a new medication.   You are prescribed prednisone, a steroid in the ED for @Diagnosis @. This is a medication to help reduce inflammation in your back.  Common side effects include upset stomach/nausea. You may take  this medicine with food if this occurs. Other side effects include restlessness, difficulty sleeping, and increased sweating. Call your healthcare provider if these do not resolve after finishing the medication.  This medicine may increase your blood sugar so additional careful monitoring is needed of blood sugar if you have diabetes. Call your healthcare provider for any signs/symtpoms of high blood sugar such as confusion, feeling sleepy, more thirst, more hunger, passing urine more often, flushing, fast breathing, or breath that smells like fruit.   Home care instructions:   Low back pain gets worse the longer you stay stationary. Please keep moving and walking as tolerated. There are exercises included in this packet to perform as tolerated for your low back pain.   Apply heat to the areas that are painful. Avoid twisting or bending your trunk to lift something. Do not lift anything above 25 lbs while recovering from this flare of low back pain.  Please follow any educational materials contained in this packet.   Follow-up instructions: Please follow-up with your primary care provider in next week for further evaluation of your symptoms if they are not completely improved.   Return instructions:  Please return to the Emergency Department if you experience worsening symptoms.  Please return for any fever or chills in the setting of your back pain, weakness in the muscles of the legs, numbness in your legs and feet that is new or changing, numbness in the area where you wipe, retention of your urine, loss of bowel or bladder control, or problems with walking. Please return if you have any  other emergent concerns.  Additional Information:   Your vital signs today were: BP 122/81 (BP Location: Left Arm)    Pulse 98    Temp (!) 97.4 F (36.3 C) (Oral)    Resp 20    Ht 5\' 4"  (1.626 m)    Wt 104.3 kg (230 lb)    SpO2 98%    BMI 39.48 kg/m  If your blood pressure (BP) was elevated on multiple  readings during this visit above 130 for the top number or above 80 for the bottom number, please have this repeated by your primary care provider within one month. --------------  Thank you for allowing us to participate in your care today.

## 2017-01-19 NOTE — ED Notes (Signed)
Pt assisted to wheelchair. BP 129/76

## 2017-01-19 NOTE — ED Triage Notes (Signed)
Patient states that she bent down to pick something up at about 3 pm and felt her "back snap" - 4 advil 3 pm - the patient reports that she has had a strain to her back before. Patient reports that she is having increased pain with standing up

## 2017-01-19 NOTE — ED Notes (Signed)
Pt given d/c instructions as per chart. Rx x 3. Verbalizes understanding. No questions. 

## 2017-04-11 DIAGNOSIS — M48061 Spinal stenosis, lumbar region without neurogenic claudication: Secondary | ICD-10-CM

## 2017-04-11 HISTORY — DX: Spinal stenosis, lumbar region without neurogenic claudication: M48.061

## 2017-08-18 DIAGNOSIS — E119 Type 2 diabetes mellitus without complications: Secondary | ICD-10-CM

## 2017-08-18 DIAGNOSIS — H353111 Nonexudative age-related macular degeneration, right eye, early dry stage: Secondary | ICD-10-CM

## 2017-08-18 DIAGNOSIS — H2513 Age-related nuclear cataract, bilateral: Secondary | ICD-10-CM | POA: Insufficient documentation

## 2017-08-18 DIAGNOSIS — H43393 Other vitreous opacities, bilateral: Secondary | ICD-10-CM

## 2017-08-18 DIAGNOSIS — H524 Presbyopia: Secondary | ICD-10-CM

## 2017-08-18 DIAGNOSIS — Z7984 Long term (current) use of oral hypoglycemic drugs: Secondary | ICD-10-CM

## 2017-08-18 HISTORY — DX: Presbyopia: H52.4

## 2017-08-18 HISTORY — DX: Long term (current) use of oral hypoglycemic drugs: Z79.84

## 2017-08-18 HISTORY — DX: Other vitreous opacities, bilateral: H43.393

## 2017-08-18 HISTORY — DX: Nonexudative age-related macular degeneration, right eye, early dry stage: H35.3111

## 2017-08-18 HISTORY — DX: Type 2 diabetes mellitus without complications: E11.9

## 2017-08-18 HISTORY — DX: Age-related nuclear cataract, bilateral: H25.13

## 2017-10-09 ENCOUNTER — Other Ambulatory Visit (HOSPITAL_COMMUNITY)
Admission: RE | Admit: 2017-10-09 | Discharge: 2017-10-09 | Disposition: A | Discharge status: Home / Self Care | Payer: Managed Care, Other (non HMO) | Source: Ambulatory Visit | Attending: Family Medicine | Admitting: Family Medicine

## 2017-10-09 ENCOUNTER — Other Ambulatory Visit: Payer: Self-pay | Admitting: Family Medicine

## 2017-10-09 DIAGNOSIS — Z01411 Encounter for gynecological examination (general) (routine) with abnormal findings: Secondary | ICD-10-CM | POA: Diagnosis not present

## 2017-10-10 LAB — CYTOLOGY - PAP
Adequacy: ABSENT
DIAGNOSIS: NEGATIVE

## 2017-11-02 ENCOUNTER — Other Ambulatory Visit: Payer: Self-pay | Admitting: Cardiology

## 2017-11-02 DIAGNOSIS — Z87891 Personal history of nicotine dependence: Secondary | ICD-10-CM

## 2017-11-02 DIAGNOSIS — I7090 Unspecified atherosclerosis: Secondary | ICD-10-CM

## 2017-11-02 DIAGNOSIS — E663 Overweight: Secondary | ICD-10-CM

## 2017-11-02 DIAGNOSIS — I10 Essential (primary) hypertension: Secondary | ICD-10-CM

## 2017-11-02 DIAGNOSIS — E7800 Pure hypercholesterolemia, unspecified: Secondary | ICD-10-CM

## 2017-11-02 DIAGNOSIS — R600 Localized edema: Secondary | ICD-10-CM

## 2017-11-09 ENCOUNTER — Other Ambulatory Visit: Payer: Self-pay | Admitting: Cardiology

## 2017-11-09 DIAGNOSIS — R600 Localized edema: Secondary | ICD-10-CM

## 2017-11-09 DIAGNOSIS — E7800 Pure hypercholesterolemia, unspecified: Secondary | ICD-10-CM

## 2017-11-09 DIAGNOSIS — I10 Essential (primary) hypertension: Secondary | ICD-10-CM

## 2017-11-09 DIAGNOSIS — E663 Overweight: Secondary | ICD-10-CM

## 2017-11-09 DIAGNOSIS — Z87891 Personal history of nicotine dependence: Secondary | ICD-10-CM

## 2017-11-09 DIAGNOSIS — I7090 Unspecified atherosclerosis: Secondary | ICD-10-CM

## 2018-11-15 IMAGING — CT CT ANGIO AOBIFEM WO/W CM
1 of 3 series · 10 of 32 positions shown, 13 images · IV contrast (APPLIED)
Comparison: Noninvasive lower extremity ABIs - 10/03/2016.

CLINICAL DATA: Bilateral lower extremity swelling. Abnormal
noninvasive vascular examination. Evaluate for PAD.

EXAM:
CT ANGIOGRAPHY OF ABDOMINAL AORTA WITH ILIOFEMORAL RUNOFF
TECHNIQUE: Multidetector CT imaging of the abdomen, pelvis and lower
extremities was performed using the standard protocol during bolus
administration of intravenous contrast. Multiplanar CT image
reconstructions and MIPs were obtained to evaluate the vascular
anatomy.
CONTRAST:  125mL JNV25V-UAA IOPAMIDOL (JNV25V-UAA) INJECTION 61%

[Series 5: angiorunoff 5.0 b31s · axial · 0.98mm/px · z∈[-1278,-88]mm · 10 of 272 slices shown, 13 images]
[im 17/272  soft-tissue]
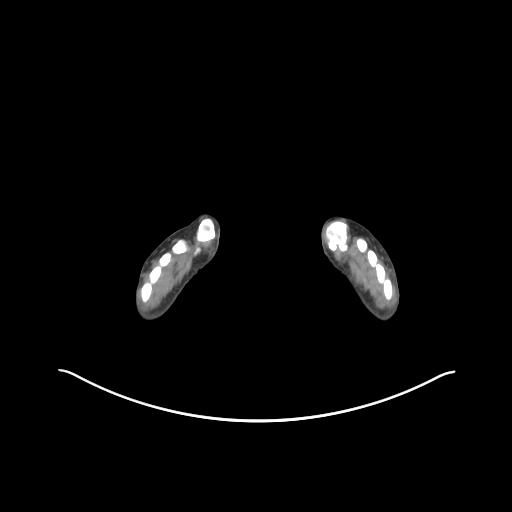
[im 17/272  bone]
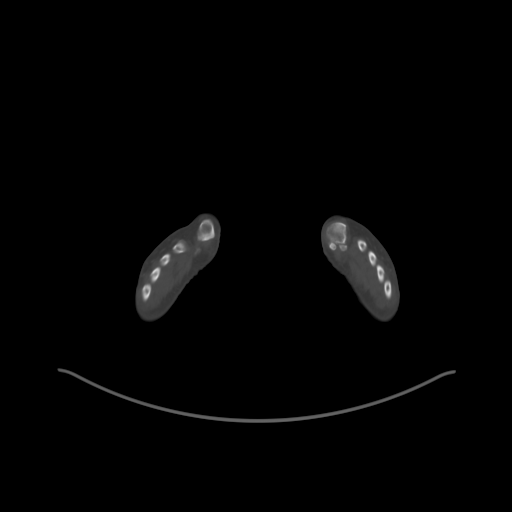
[im 51/272  soft-tissue]
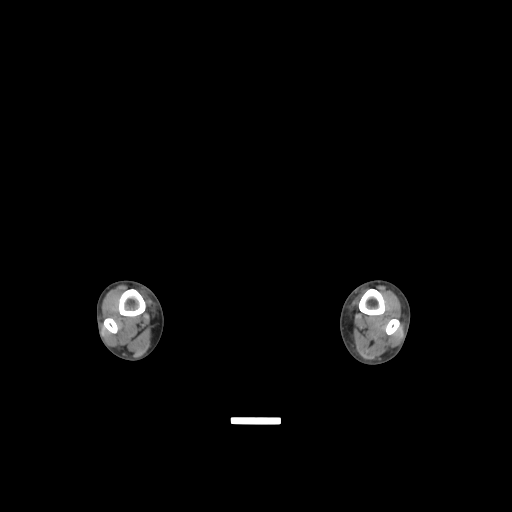
[im 85/272  soft-tissue]
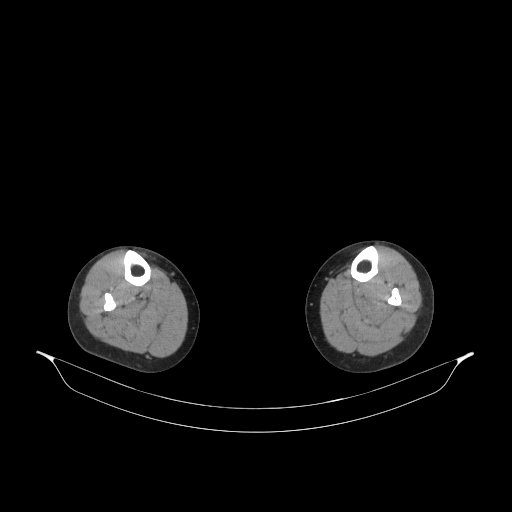
[im 119/272  soft-tissue]
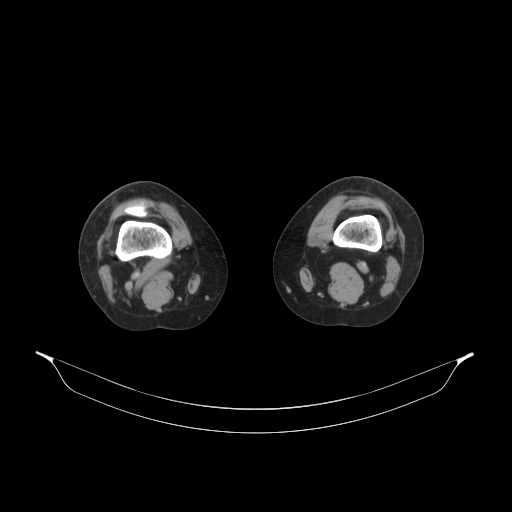
[im 153/272  soft-tissue]
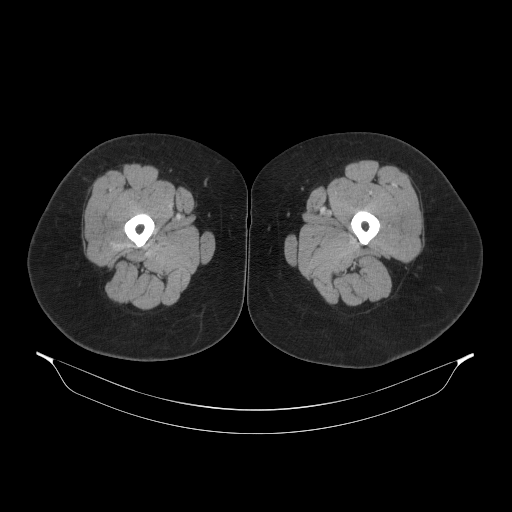
[im 187/272  soft-tissue]
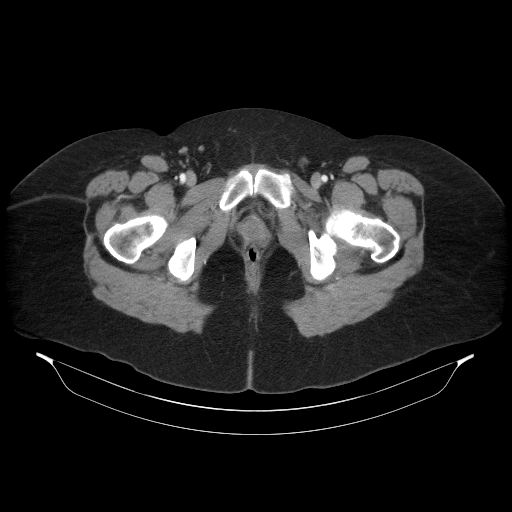
[im 204/272  lung]
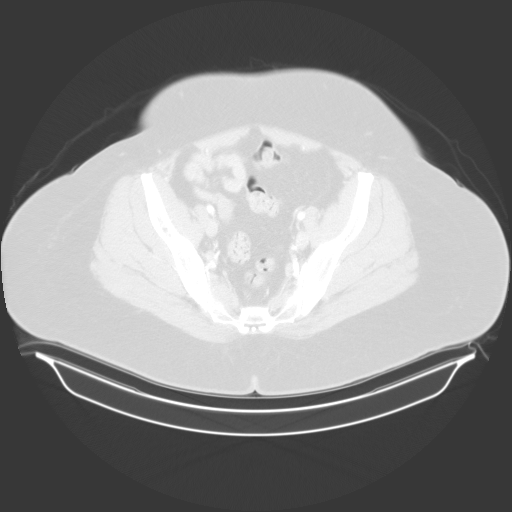
[im 221/272  soft-tissue]
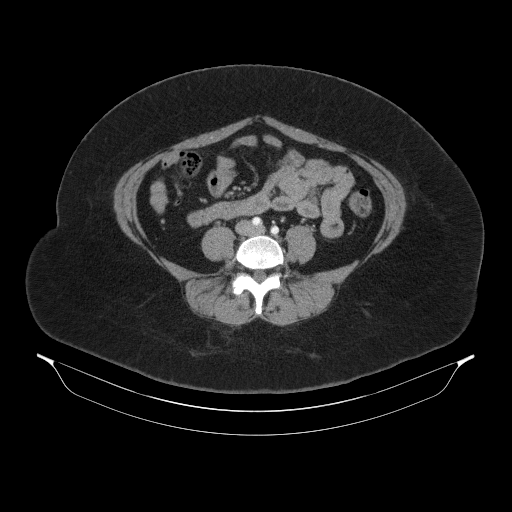
[im 221/272  lung]
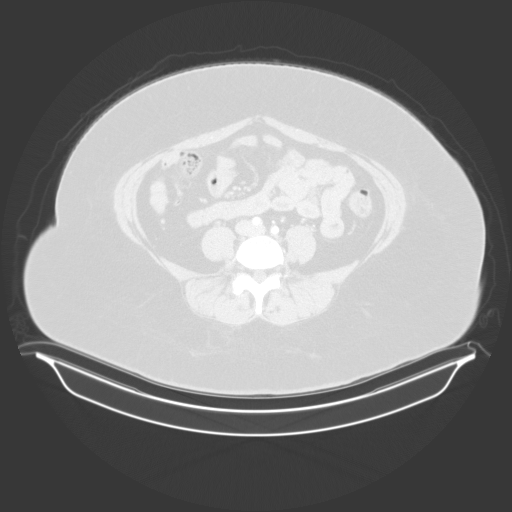
[im 238/272  lung]
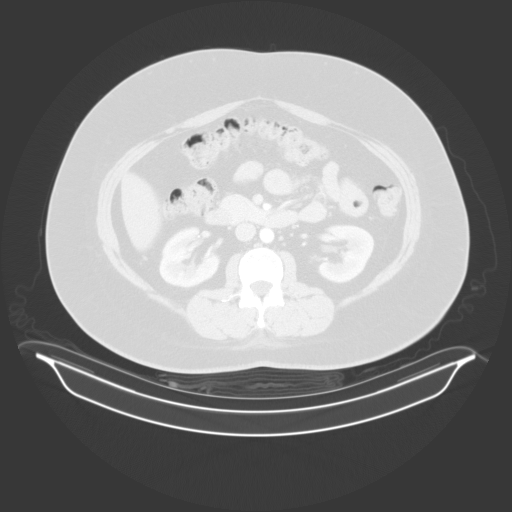
[im 255/272  soft-tissue]
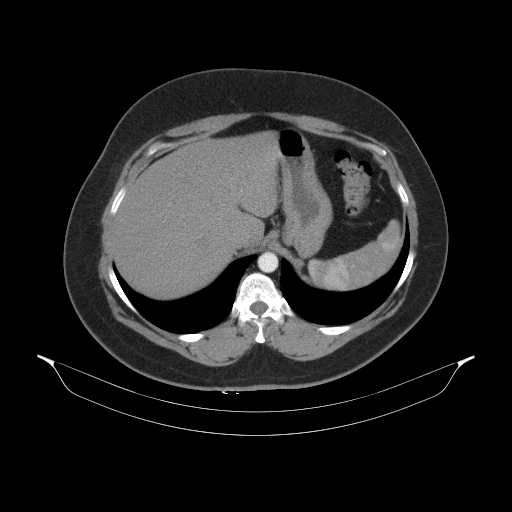
[im 255/272  lung]
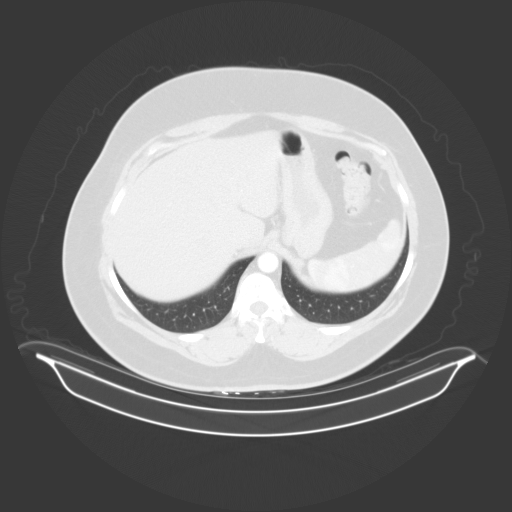

[10 of 32 positions shown; findings below may reference images not displayed]

FINDINGS: VASCULAR

Aorta: Minimal to moderate amount of eccentric mixed calcified and
noncalcified atherosclerotic plaque with a normal caliber abdominal
aorta, not resulting in hemodynamically significant stenosis. No
evidence of abdominal aortic aneurysm or dissection. No perivascular
stranding.

Celiac: Widely patent without hemodynamically significant stenosis.
Conventional branching pattern.

SMA: Widely patent without hemodynamically significant stenosis.
Conventional branching pattern.

Renals: Right-sided renal artery is duplicated with tiny accessory
right renal artery which supplies the inferior pole the left kidney.
Both dominant bilateral renal arteries are widely patent without
hemodynamically significant stenosis. No vessel irregularity to
suggest FMD.

IMA: Remains patent.

RIGHT LOWER EXTREMITY VASCULATURE (note, evaluation of the extremity
vasculature is degraded secondary to suboptimal vessel
opacification):

Right-sided pelvic vasculature: There is a minimal amount of mixed
calcified and noncalcified atherosclerotic plaque involving the
origin of the normal caliber right common iliac artery, not
resulting in a hemodynamically significant stenosis. The right
internal iliac artery is mildly diseased though patent of normal
caliber. The right external iliac artery is of normal caliber and
widely patent without hemodynamically significant stenosis.

Right common femoral artery: There is a very minimal amount of
eccentric mixed calcified and noncalcified atherosclerotic plaque
within the right common femoral artery (image 81, series 5), not
resulting in hemodynamically significant stenosis.

Right deep femoral artery: Widely patent without hemodynamically
significant narrowing.

Right superficial femoral artery: There is a very minimal amount of
calcified atherosclerotic plaque within the distal aspect of the
right superficial femoral artery at the level of the adductor canal
(image 135, series 5), not resulting in a hemodynamically
significant stenosis.

Right popliteal artery: Both the left above and below-knee popliteal
arteries are of normal caliber and widely patent without
hemodynamically significant narrowing.

Right lower leg: Evaluation of the tibial vasculature is degraded
secondary suboptimal vessel opacification however there appears to
be a three-vessel runoff to the right lower leg and foot. The
right-sided dorsalis pedis artery appears patent to the level of at
least the midfoot. No evidence of distal embolism.

---------------------------------------------------------------------------------

LEFT LOWER EXTREMITY VASCULATURE (note, evaluation of the extremity
vasculature is degraded secondary to suboptimal vessel
opacification):

Left-sided pelvic vasculature: There is a minimal amount of
predominantly calcified atherosclerotic plaque involving the origin
of the normal caliber left common iliac artery, not resulting in a
hemodynamically significant stenosis. The left internal iliac artery
is mildly diseased though patent and of normal caliber. The left
external iliac artery is patent and of normal caliber

Left common femoral artery: There is a minimal amount of mixed
calcified and noncalcified atherosclerotic plaque within the left
common femoral artery (image 83, series 5), not resulting in a
hemodynamically significant stenosis.

Left deep femoral artery: Widely patent without hemodynamically
significant narrowing.

Left superficial femoral artery: Very minimal amount of calcified
atherosclerotic plaque within distal aspect of the left superficial
femoral artery at the level of the adductor canal (image 135, series
5), not resulting in a hemodynamically significant stenosis.

Left popliteal artery: Both the left and above and below-knee
popliteal arteries are of normal caliber and widely patent without
hemodynamically significant stenosis.

Left lower leg: Evaluation of the distal tibial vasculature is
degraded secondary to suboptimal vessel opacification, however there
appears to be a three-vessel runoff to the left lower leg and foot.
The left-sided dorsalis pedis artery appears patent to the level of
at least the midfoot. No evidence of distal embolism.

Venous: Normal appearance of the proximal thigh and pelvic venous
system and IVC on this arterial phase examination.

Review of the MIP images confirms the above findings.

______________________________________________________

NON-VASCULAR

Evaluation the abdominal organs is limited to the arterial phase of
enhancement.

Lower chest: Limited visualization of the lower thorax is negative
for focal airspace opacity or pleural effusion.

Normal heart size.  No pericardial effusion.

Hepatobiliary: Normal hepatic contour. No discrete hyperenhancing
hepatic lesions. Normal appearance of the gallbladder. No radiopaque
gallstones. No intra extrahepatic biliary duct dilatation. No
ascites.

Pancreas: Normal appearance of the pancreas

Spleen: Normal early arterial phase appearance of the spleen.

Adrenals/Urinary Tract: There is symmetric enhancement of the
bilateral kidneys. No definite renal stones this postcontrast
examination. No discrete renal lesions. No urine obstruction or
perinephric stranding. Normal appearance of the bilateral adrenal
glands. Normal appearance of the urinary bladder given degree
distention.

Stomach/Bowel: Moderate colonic stool burden without evidence of
enteric obstruction. Normal appearance of the terminal ileum and
appendix. No pneumoperitoneum, pneumatosis or portal venous gas.

Lymphatic: No bulky retroperitoneal, mesenteric, pelvic or inguinal
lymphadenopathy.

Reproductive: Normal appearance of the pelvic organs. No discrete
adnexal lesion.

Other: Small mesenteric fat containing periumbilical hernia. Minimal
amount of subcutaneous edema about the midline of the low back.

Musculoskeletal: No acute or aggressive osseous abnormalities. Mild
DDD of T8-T9 with disc space height loss, endplate irregularity and
sclerosis.
IMPRESSION: VASCULAR

1. Very minimal amount of atherosclerotic plaque within a normal
caliber abdominal aorta, not resulting in a hemodynamically
significant stenosis. Aortic Atherosclerosis (B534F-[id number].B).
2. Minimal amount of atherosclerotic plaque within the bilateral
common and superficial femoral arteries, not resulting in a
hemodynamically significant stenosis.
3. Suboptimal evaluation of the tibial vasculature secondary to
vessel opacification, however there appears to be a three-vessel
runoff to the bilateral lower legs and feet. The dorsalis pedis
arteries appear patent to the level of (at least) the midfoot
bilaterally.
NON-VASCULAR

1. Unremarkable CT of the abdomen pelvis as detailed above.

## 2019-03-12 ENCOUNTER — Encounter: Payer: Self-pay | Admitting: Cardiology

## 2019-03-12 ENCOUNTER — Other Ambulatory Visit: Payer: Self-pay

## 2019-03-12 ENCOUNTER — Ambulatory Visit: Payer: Managed Care, Other (non HMO) | Admitting: Cardiology

## 2019-03-12 ENCOUNTER — Ambulatory Visit (INDEPENDENT_AMBULATORY_CARE_PROVIDER_SITE_OTHER): Payer: Managed Care, Other (non HMO) | Admitting: Cardiology

## 2019-03-12 VITALS — BP 132/90 | HR 93 | Ht 64.0 in | Wt 217.0 lb

## 2019-03-12 DIAGNOSIS — I7090 Unspecified atherosclerosis: Secondary | ICD-10-CM

## 2019-03-12 DIAGNOSIS — E782 Mixed hyperlipidemia: Secondary | ICD-10-CM

## 2019-03-12 DIAGNOSIS — I10 Essential (primary) hypertension: Secondary | ICD-10-CM

## 2019-03-12 HISTORY — DX: Unspecified atherosclerosis: I70.90

## 2019-03-12 HISTORY — DX: Mixed hyperlipidemia: E78.2

## 2019-03-12 HISTORY — DX: Essential (primary) hypertension: I10

## 2019-03-12 NOTE — Progress Notes (Signed)
Cardiology Office Note:    Date:  03/12/2019   ID:  Renee Stevens, DOB 31-May-1970, MRN [id number]  PCP:  Darcus Austin, MD (Inactive)  Cardiologist:  Jenean Lindau, MD   Referring MD: Maurice Small, MD    ASSESSMENT:    1. Atherosclerotic vascular disease   2. Essential hypertension   3. Mixed dyslipidemia    PLAN:    In order of problems listed above:  1. Palpitations: I reassured the patient about my findings.  She plans to get antiblood work done by her primary care physician in the next few days and send me a copy.  I will await that.  I reassured her about my findings.  If there are recurrent and occur more frequently we will do a ZIO monitoring. 2. Essential hypertension: Blood pressure is stable and diet was discussed lifestyle modification was urged 3. Diabetes mellitus and mixed dyslipidemia: Importance of regular exercise stressed diet was emphasized weight reduction was stressed she promises to do better.  Risks of obesity explained. 4. Patient will be seen in follow-up appointment in 6 months or earlier if the patient has any concerns    Medication Adjustments/Labs and Tests Ordered: Current medicines are reviewed at length with the patient today.  Concerns regarding medicines are outlined above.  Orders Placed This Encounter  Procedures  . EKG 12-Lead   No orders of the defined types were placed in this encounter.    Chief Complaint  Patient presents with  . Tachycardia  . Palpitations     History of Present Illness:    Renee Stevens is a 49 y.o. female.  Patient has past medical history of atherosclerotic vascular disease essential hypertension and dyslipidemia.  She is overweight.  She leads a sedentary lifestyle.  She walks on a regular basis.  She walks her dog without any symptoms.  At the time of my evaluation, the patient is alert awake oriented and in no distress.  She mentions to me that she occasionally has palpitations.  This does not distress  her much.  And they are very sporadic occurring once in a couple of months.  Past Medical History:  Diagnosis Date  . Aneurysm ([place])   . Anxiety   . Diabetes mellitus without complication ([place])   . Hidradenitis   . Hyperlipidemia   . Hypertension   . Sleep apnea   . Vitamin D deficiency     Past Surgical History:  Procedure Laterality Date  . LEEP     1990 AND 2008    Current Medications: Current Meds  Medication Sig  . aspirin EC 325 MG tablet Take 1 tablet (325 mg total) by mouth daily.  Marland Kitchen atorvastatin (LIPITOR) 80 MG tablet Take 80 mg by mouth daily.  Marland Kitchen glimepiride (AMARYL) 4 MG tablet Take 4 mg by mouth daily with breakfast.  . glucose blood test strip 1 each by Other route as needed for other. Use as instructed  . ibuprofen (ADVIL,MOTRIN) 200 MG tablet Take 200 mg by mouth every 6 (six) hours as needed.  Marland Kitchen lisinopril-hydrochlorothiazide (PRINZIDE,ZESTORETIC) 10-12.5 MG per tablet Take 1 tablet by mouth daily.  . medroxyPROGESTERone (DEPO-PROVERA) 150 MG/ML injection Inject 150 mg into the muscle every 3 (three) months.  . metFORMIN (GLUCOPHAGE) 500 MG tablet Take 1,000 mg by mouth 2 (two) times daily with a meal.   . methocarbamol (ROBAXIN) 500 MG tablet Take 1 tablet (500 mg total) by mouth 2 (two) times daily.  . Vitamin D, Ergocalciferol, (DRISDOL)  50000 UNITS CAPS capsule Take 50,000 Units by mouth every 7 (seven) days. Twice Weekly     Allergies:   Patient has no known allergies.   Social History   Socioeconomic History  . Marital status: Married    Spouse name: Not on file  . Number of children: Not on file  . Years of education: Not on file  . Highest education level: Not on file  Occupational History  . Not on file  Tobacco Use  . Smoking status: Former Smoker    Quit date: 02/02/2012    Years since quitting: 7.1  . Smokeless tobacco: Never Used  Substance and Sexual Activity  . Alcohol use: No    Alcohol/week: 0.0 standard drinks  . Drug use: No    . Sexual activity: Yes    Partners: Male  Other Topics Concern  . Not on file  Social History Narrative  . Not on file   Social Determinants of Health   Financial Resource Strain:   . Difficulty of Paying Living Expenses: Not on file  Food Insecurity:   . Worried About Programme researcher, broadcasting/film/video in the Last Year: Not on file  . Ran Out of Food in the Last Year: Not on file  Transportation Needs:   . Lack of Transportation (Medical): Not on file  . Lack of Transportation (Non-Medical): Not on file  Physical Activity:   . Days of Exercise per Week: Not on file  . Minutes of Exercise per Session: Not on file  Stress:   . Feeling of Stress : Not on file  Social Connections:   . Frequency of Communication with Friends and Family: Not on file  . Frequency of Social Gatherings with Friends and Family: Not on file  . Attends Religious Services: Not on file  . Active Member of Clubs or Organizations: Not on file  . Attends Banker Meetings: Not on file  . Marital Status: Not on file     Family History: The patient's family history includes Aneurysm in her sister; Hypertension in her mother; Stroke in her maternal grandmother.  ROS:   Please see the history of present illness.    All other systems reviewed and are negative.  EKGs/Labs/Other Studies Reviewed:    The following studies were reviewed today: Study Highlights    Nuclear stress EF: 70%.  There was no ST segment deviation noted during stress.  The study is normal.  This is a low risk study.  The left ventricular ejection fraction is hyperdynamic (>65%).   Normal pharmacologic nuclear stress test with no evidence for prior infarct or ischemia.       Recent Labs: No results found for requested labs within last 8760 hours.  Recent Lipid Panel No results found for: CHOL, TRIG, HDL, CHOLHDL, VLDL, LDLCALC, LDLDIRECT  Physical Exam:    VS:  BP 132/90   Pulse 93   Ht 5\' 4"  (1.626 m)   Wt 217 lb  (98.4 kg)   SpO2 98%   BMI 37.25 kg/m     Wt Readings from Last 3 Encounters:  03/12/19 217 lb (98.4 kg)  01/19/17 230 lb (104.3 kg)  01/06/17 231 lb 1.3 oz (104.8 kg)     GEN: Patient is in no acute distress HEENT: Normal NECK: No JVD; No carotid bruits LYMPHATICS: No lymphadenopathy CARDIAC: Hear sounds regular, 2/6 systolic murmur at the apex. RESPIRATORY:  Clear to auscultation without rales, wheezing or rhonchi  ABDOMEN: Soft, non-tender, non-distended MUSCULOSKELETAL:  No edema; No deformity  SKIN: Warm and dry NEUROLOGIC:  Alert and oriented x 3 PSYCHIATRIC:  Normal affect   Signed, Garwin Brothers, MD  03/12/2019 11:34 AM    [facility]

## 2019-03-12 NOTE — Patient Instructions (Signed)
Medication Instructions:  Your physician recommends that you continue on your current medications as directed. Please refer to the Current Medication list given to you today.  *If you need a refill on your cardiac medications before your next appointment, please call your pharmacy*  Lab Work: NONE If you have labs (blood work) drawn today and your tests are completely normal, you will receive your results only by: Marland Kitchen MyChart Message (if you have MyChart) OR . A paper copy in the mail If you have any lab test that is abnormal or we need to change your treatment, we will call you to review the results.  Testing/Procedures: You had an EKG Performed today  Follow-Up: At [facility], you and your health needs are our priority.  As part of our continuing mission to provide you with exceptional heart care, we have created designated Provider Care Teams.  These Care Teams include your primary Cardiologist (physician) and Advanced Practice Providers (APPs -  Physician Assistants and Nurse Practitioners) who all work together to provide you with the care you need, when you need it.  Your next appointment:   3 month(s)  The format for your next appointment:   In Person  Provider:   Belva Crome, MD

## 2019-05-07 ENCOUNTER — Other Ambulatory Visit: Payer: Self-pay | Admitting: Family Medicine

## 2019-05-07 DIAGNOSIS — Z1231 Encounter for screening mammogram for malignant neoplasm of breast: Secondary | ICD-10-CM

## 2019-05-20 ENCOUNTER — Other Ambulatory Visit: Payer: Self-pay

## 2019-05-20 ENCOUNTER — Ambulatory Visit
Admission: RE | Admit: 2019-05-20 | Discharge: 2019-05-20 | Disposition: A | Discharge status: Home / Self Care | Payer: Managed Care, Other (non HMO) | Source: Ambulatory Visit | Attending: Family Medicine | Admitting: Family Medicine

## 2019-05-20 DIAGNOSIS — Z1231 Encounter for screening mammogram for malignant neoplasm of breast: Secondary | ICD-10-CM

## 2019-06-21 ENCOUNTER — Ambulatory Visit: Payer: Managed Care, Other (non HMO) | Admitting: Cardiology

## 2022-04-16 ENCOUNTER — Other Ambulatory Visit: Payer: Self-pay

## 2022-04-16 DIAGNOSIS — Z8249 Family history of ischemic heart disease and other diseases of the circulatory system: Secondary | ICD-10-CM

## 2022-04-17 ENCOUNTER — Other Ambulatory Visit: Payer: Self-pay | Admitting: Family Medicine

## 2022-04-17 DIAGNOSIS — Z8249 Family history of ischemic heart disease and other diseases of the circulatory system: Secondary | ICD-10-CM

## 2022-04-25 ENCOUNTER — Ambulatory Visit
Admission: RE | Admit: 2022-04-25 | Discharge: 2022-04-25 | Disposition: A | Discharge status: Home / Self Care | Payer: Managed Care, Other (non HMO) | Source: Ambulatory Visit | Attending: Family Medicine | Admitting: Family Medicine

## 2022-04-25 DIAGNOSIS — Z8249 Family history of ischemic heart disease and other diseases of the circulatory system: Secondary | ICD-10-CM

## 2022-06-20 ENCOUNTER — Other Ambulatory Visit: Payer: Self-pay | Admitting: Neurosurgery

## 2022-06-20 DIAGNOSIS — I671 Cerebral aneurysm, nonruptured: Secondary | ICD-10-CM

## 2022-06-21 ENCOUNTER — Other Ambulatory Visit: Payer: Self-pay | Admitting: Neurosurgery

## 2022-07-11 ENCOUNTER — Inpatient Hospital Stay (HOSPITAL_COMMUNITY)
Admission: RE | Admit: 2022-07-11 | Discharge: 2022-07-11 | Disposition: A | Discharge status: Home / Self Care | Payer: Managed Care, Other (non HMO) | Source: Ambulatory Visit | Attending: Neurosurgery | Admitting: Neurosurgery

## 2023-03-05 ENCOUNTER — Emergency Department (HOSPITAL_COMMUNITY): Payer: Managed Care, Other (non HMO)

## 2023-03-05 ENCOUNTER — Encounter (HOSPITAL_COMMUNITY): Payer: Self-pay

## 2023-03-05 ENCOUNTER — Emergency Department (HOSPITAL_COMMUNITY)
Admission: EM | Admit: 2023-03-05 | Discharge: 2023-03-05 | Disposition: A | Discharge status: Home / Self Care | Payer: Managed Care, Other (non HMO)

## 2023-03-05 ENCOUNTER — Other Ambulatory Visit: Payer: Self-pay

## 2023-03-05 DIAGNOSIS — Z7982 Long term (current) use of aspirin: Secondary | ICD-10-CM | POA: Insufficient documentation

## 2023-03-05 DIAGNOSIS — R002 Palpitations: Secondary | ICD-10-CM | POA: Diagnosis present

## 2023-03-05 DIAGNOSIS — I10 Essential (primary) hypertension: Secondary | ICD-10-CM | POA: Insufficient documentation

## 2023-03-05 DIAGNOSIS — Z7984 Long term (current) use of oral hypoglycemic drugs: Secondary | ICD-10-CM | POA: Diagnosis not present

## 2023-03-05 DIAGNOSIS — Z79899 Other long term (current) drug therapy: Secondary | ICD-10-CM | POA: Diagnosis not present

## 2023-03-05 DIAGNOSIS — R251 Tremor, unspecified: Secondary | ICD-10-CM | POA: Diagnosis not present

## 2023-03-05 DIAGNOSIS — R42 Dizziness and giddiness: Secondary | ICD-10-CM | POA: Insufficient documentation

## 2023-03-05 DIAGNOSIS — E119 Type 2 diabetes mellitus without complications: Secondary | ICD-10-CM | POA: Insufficient documentation

## 2023-03-05 LAB — CBC WITH DIFFERENTIAL/PLATELET
Abs Immature Granulocytes: 0.02 10*3/uL (ref 0.00–0.07)
Basophils Absolute: 0 10*3/uL (ref 0.0–0.1)
Basophils Relative: 1 %
Eosinophils Absolute: 0.1 10*3/uL (ref 0.0–0.5)
Eosinophils Relative: 1 %
HCT: 43.9 % (ref 36.0–46.0)
Hemoglobin: 14.5 g/dL (ref 12.0–15.0)
Immature Granulocytes: 0 %
Lymphocytes Relative: 28 %
Lymphs Abs: 2.1 10*3/uL (ref 0.7–4.0)
MCH: 31.5 pg (ref 26.0–34.0)
MCHC: 33 g/dL (ref 30.0–36.0)
MCV: 95.2 fL (ref 80.0–100.0)
Monocytes Absolute: 0.5 10*3/uL (ref 0.1–1.0)
Monocytes Relative: 7 %
Neutro Abs: 4.7 10*3/uL (ref 1.7–7.7)
Neutrophils Relative %: 63 %
Platelets: 323 10*3/uL (ref 150–400)
RBC: 4.61 MIL/uL (ref 3.87–5.11)
RDW: 12.5 % (ref 11.5–15.5)
WBC: 7.4 10*3/uL (ref 4.0–10.5)
nRBC: 0 % (ref 0.0–0.2)

## 2023-03-05 LAB — COMPREHENSIVE METABOLIC PANEL
ALT: 13 U/L (ref 0–44)
AST: 19 U/L (ref 15–41)
Albumin: 3.5 g/dL (ref 3.5–5.0)
Alkaline Phosphatase: 68 U/L (ref 38–126)
Anion gap: 12 (ref 5–15)
BUN: 11 mg/dL (ref 6–20)
CO2: 21 mmol/L — ABNORMAL LOW (ref 22–32)
Calcium: 9.8 mg/dL (ref 8.9–10.3)
Chloride: 104 mmol/L (ref 98–111)
Creatinine, Ser: 0.65 mg/dL (ref 0.44–1.00)
GFR, Estimated: >60 mL/min (ref >60)
Glucose, Bld: 101 mg/dL — ABNORMAL HIGH (ref 70–99)
Potassium: 3.6 mmol/L (ref 3.5–5.1)
Sodium: 137 mmol/L (ref 135–145)
Total Bilirubin: 0.6 mg/dL (ref 0.0–1.2)
Total Protein: 7.1 g/dL (ref 6.5–8.1)

## 2023-03-05 LAB — TROPONIN I (HIGH SENSITIVITY)
Troponin I (High Sensitivity): 4 ng/L (ref <18)
Troponin I (High Sensitivity): 4 ng/L (ref <18)

## 2023-03-05 LAB — MAGNESIUM: Magnesium: 2.2 mg/dL (ref 1.7–2.4)

## 2023-03-05 LAB — HCG, SERUM, QUALITATIVE: Preg, Serum: NEGATIVE

## 2023-03-05 NOTE — ED Provider Notes (Signed)
 [facility] AT [facility] Provider Note   CSN: [id number] Arrival date & time: 03/05/23  0303     History  Chief Complaint  Patient presents with   Palpitations    Renee Stevens is a 53 y.o. female.  The history is provided by the patient and medical records. No language interpreter was used.  Palpitations    53 year old female history of hypertension, diabetes, anxiety, hyperlipidemia, brought here via EMS from work with complaints of heart palpitation and dizziness.  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  She did look at her watch and noticed that her heart rate was at 160.  It lasted for approximately 15 minutes and she was really concerned.  EMS brought her here and since then her symptom has resolved.  She did not report any significant chest pain, headache, neck pain, shortness of breath or generalized weakness.  She mention she has had heart palpitations in the past usually accompanied with shakiness.  She also mention she has been having recurrent urinary tract infection and is currently on Cipro.  States she is 4 days into taking his Cipro and his symptoms from a urinary tract standpoint is improving.  She does vape.  She denies alcohol or tobacco use or any drug use.  No history of thyroid disease.  No increased energy drinks alcohol other changes in her vitamins.  Stress level is about the same.  Home Medications Prior to Admission medications   Medication Sig Start Date End Date Taking? Authorizing Provider  aspirin  EC 325 MG tablet Take 1 tablet (325 mg total) by mouth daily. 10/07/16   Revankar, Micael Adas, MD  atenolol (TENORMIN) 50 MG tablet Take 50 mg by mouth daily.    [provider]  atorvastatin (LIPITOR) 80 MG tablet Take 80 mg by mouth daily.    [provider]  glimepiride (AMARYL) 4 MG tablet Take 4 mg by mouth daily with  breakfast.    [provider]  glucose blood test strip 1 each by Other route as needed for other. Use as instructed    [provider]  ibuprofen (ADVIL,MOTRIN) 200 MG tablet Take 200 mg by mouth every 6 (six) hours as needed.    [provider]  lisinopril-hydrochlorothiazide (PRINZIDE,ZESTORETIC) 10-12.5 MG per tablet Take 1 tablet by mouth daily.    [provider]  medroxyPROGESTERone (DEPO-PROVERA) 150 MG/ML injection Inject 150 mg into the muscle every 3 (three) months.    [provider]  metFORMIN (GLUCOPHAGE) 500 MG tablet Take 1,000 mg by mouth 2 (two) times daily with a meal.     [provider]  methocarbamol  (ROBAXIN ) 500 MG tablet Take 1 tablet (500 mg total) by mouth 2 (two) times daily. 01/19/17   Murray, Alyssa B, PA-C  ondansetron  (ZOFRAN ) 4 MG tablet Take 1 tablet (4 mg total) by mouth every 6 (six) hours. Patient not taking: Reported on 03/12/2019 01/19/17   Arneta Lango B, PA-C  pioglitazone (ACTOS) 15 MG tablet Take 15 mg by mouth daily.    [provider]  Vitamin D, Ergocalciferol, (DRISDOL) 50000 UNITS CAPS capsule Take 50,000 Units by mouth every 7 (seven) days. Twice Weekly    [provider]      Allergies    Patient has no known allergies.    Review of Systems   Review of Systems  Cardiovascular:  Positive for  palpitations.  All other systems reviewed and are negative.   Physical Exam Updated Vital Signs BP (!) 173/74 (BP Location: Right Arm)   Pulse 67   Temp 97.7 F (36.5 C) (Oral)   Resp 16   Ht 5\' 4"  (1.626 m)   Wt 63.5 kg   SpO2 97%   BMI 24.03 kg/m  Physical Exam Vitals and nursing note reviewed.  Constitutional:      General: She is not in acute distress.    Appearance: She is well-developed.  HENT:     Head: Atraumatic.  Eyes:     Conjunctiva/sclera: Conjunctivae normal.  Neck:     Vascular: No carotid bruit.     Comments: No thyromegaly Cardiovascular:     Rate  and Rhythm: Normal rate and regular rhythm.     Pulses: Normal pulses.     Heart sounds: Normal heart sounds.  Pulmonary:     Effort: Pulmonary effort is normal.     Breath sounds: No wheezing, rhonchi or rales.  Abdominal:     Palpations: Abdomen is soft.     Tenderness: There is no abdominal tenderness.  Musculoskeletal:     Cervical back: Normal range of motion and neck supple. No rigidity or tenderness.  Lymphadenopathy:     Cervical: No cervical adenopathy.  Skin:    Findings: No rash.  Neurological:     Mental Status: She is alert.  Psychiatric:        Mood and Affect: Mood normal.     ED Results / Procedures / Treatments   Labs (all labs ordered are listed, but only abnormal results are displayed) Labs Reviewed  COMPREHENSIVE METABOLIC PANEL - Abnormal; Notable for the following components:      Result Value   CO2 21 (*)    Glucose, Bld 101 (*)    All other components within normal limits  CBC WITH DIFFERENTIAL/PLATELET  MAGNESIUM  HCG, SERUM, QUALITATIVE  TROPONIN I (HIGH SENSITIVITY)  TROPONIN I (HIGH SENSITIVITY)    EKG EKG Interpretation Date/Time:  Wednesday March 05 2023 03:46:41 EST Ventricular Rate:  96 PR Interval:  160 QRS Duration:  80 QT Interval:  357 QTC Calculation: 452 R Axis:   -11  Text Interpretation: Sinus rhythm Confirmed by Kelsey Patricia [id number]) on 03/05/2023 4:51:27 AM  Radiology DG Chest 2 View Result Date: 03/05/2023 CLINICAL DATA:  Palpitations, shortness of breath EXAM: CHEST - 2 VIEW COMPARISON:  08/22/2022 FINDINGS: Lungs are clear.  No pleural effusion or pneumothorax. The heart is normal in size. Mild degenerative changes of the visualized thoracolumbar spine. IMPRESSION: Normal chest radiographs. Electronically Signed   By: Zadie Herter M.D.   On: 03/05/2023 03:49    Procedures Procedures    Medications Ordered in ED Medications - No data to display  ED Course/ Medical Decision Making/ A&P                                  Medical Decision Making  BP (!) 173/74 (BP Location: Right Arm)   Pulse 67   Temp 97.7 F (36.5 C) (Oral)   Resp 16   Ht 5\' 4"  (1.626 m)   Wt 63.5 kg   SpO2 97%   BMI 24.03 kg/m   23:32 AM  53 year old female history of hypertension, diabetes, anxiety, hyperlipidemia, brought here via EMS from work with complaints of heart palpitation and dizziness.  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  She did look at her watch and noticed that her heart rate was at 160.  It lasted for approximately 15 minutes and she was really concerned.  EMS brought her here and since then her symptom has resolved.  She did not report any significant chest pain, headache, neck pain, shortness of breath or generalized weakness.  She mention she has had heart palpitations in the past usually accompanied with shakiness.  She also mention she has been having recurrent urinary tract infection and is currently on Cipro.  States she is 4 days into taking his Cipro and his symptoms from a urinary tract standpoint is improving.  She does vape.  She denies alcohol or tobacco use or any drug use.  No history of thyroid disease.  No increased energy drinks alcohol other changes in her vitamins.  Stress level is about the same.  Patient mention she has been evaluated for the symptoms in the past and was attributed to having low potassium and low magnesium due to being on Ozempic and not eating drinking much.  She mention she is doing better from that standpoint as she is more tolerance of her Ozempic and is eating more regularly.  Exam overall reassuring, no thyromegaly, heart with normal rate and rhythm, lungs clear to auscultation bilaterally abdomen is soft nontender intact distal pulses to both wrists.  -Labs ordered, independently viewed and interpreted by me.  Labs remarkable for negative delta trop.  Normal electrolytes panel,  normal WBC normal H&H -The patient was maintained on a cardiac monitor.  I personally viewed and interpreted the cardiac monitored which showed an underlying rhythm of: NSR -Imaging independently viewed and interpreted by me and I agree with radiologist's interpretation.  Result remarkable for CXR unremarkable -This patient presents to the ED for concern of heart palpitation, this involves an extensive number of treatment options, and is a complaint that carries with it a high risk of complications and morbidity.  The differential diagnosis includes cardiac arrhythmia, anemia, hypovolemia, electrolytes imbalance, PE, thyroid disease, anxiety -Co morbidities that complicate the patient evaluation includes DM, anxiety, HTN -Treatment includes reassurance -Reevaluation of the patient after these medicines showed that the patient improved -PCP office notes or outside notes reviewed -Escalation to admission/observation considered: patients feels much better, is comfortable with discharge, and will follow up with cardiology -Prescription medication considered, patient comfortable with OTC meds -Social Determinant of Health considered which includes vaping  Encourage patient to follow-up with cardiology for outpatient evaluation of her heart palpitation.  She may benefit from cardiac monitoring such as a Holter monitoring or Zio patch.  Patient was understanding and agrees with plan.  Gave patient return precaution.         Final Clinical Impression(s) / ED Diagnoses Final diagnoses:  Heart palpitations    Rx / DC Orders ED Discharge Orders          Ordered    Ambulatory referral to Cardiology       Comments: If you have not heard from the Cardiology office within the next 72 hours please call [phone].   03/05/23 0734              Debbra Fairy, PA-C 03/05/23 8295    Carin Charleston, MD 03/05/23 [phone]

## 2023-03-05 NOTE — Discharge Instructions (Addendum)
 You have been evaluated for your symptoms.  Fortunately no concerning findings were noted on today's exam.  Cardiology office will reach out to you in the next several days to schedule for outpatient evaluation.  If you do not hear from them, please call the number provided.  Return if you have any concern.

## 2023-03-05 NOTE — ED Provider Triage Note (Signed)
 Emergency Medicine Provider Triage Evaluation Note  Renee Stevens , a 53 y.o. female  was evaluated in triage.  Pt complains of palpitations, sob. States was admitted at [facility] for similar [facility].  States she felt tremulous, heart racing, short of breath checked her watch and her heart rates in the 160s.  She has seen cards outpatient for this previously without official diagnosis.  She is currently on Cipro for UTI.  States she has been having diarrhea without any blood.  No abdominal pain.  Was told previously palpitations were likely due to low potassium and magnesium  Review of Systems  Positive: Palpitations Negative: Fever, cough, LE edema  Physical Exam  BP (!) 168/99 (BP Location: Left Arm)   Pulse 98   Temp 98.4 F (36.9 C) (Oral)   Resp 16   SpO2 97% Comment: Simultaneous filing. User may not have seen previous data. Gen:   Awake, no distress   Resp:  Normal effort  MSK:   Moves extremities without difficulty  Other:    Medical Decision Making  Medically screening exam initiated at 3:20 AM.  Appropriate orders placed.  Renee Stevens was informed that the remainder of the evaluation will be completed by another provider, this initial triage assessment does not replace that evaluation, and the importance of remaining in the ED until their evaluation is complete.  palpitations   Jassiah Viviano A, PA-C 03/05/23 0322

## 2023-03-05 NOTE — ED Triage Notes (Signed)
 Arrives [facility] from work with dizziness and palpitations. 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 Looked at her apple watch and had a notification for heart rate of 160.   Says she was seen in hospital in July after a similar presentation and Magnesium / Potassium were low.   Taking Ciprofloxacin for a lingering UTI.

## 2023-03-20 ENCOUNTER — Other Ambulatory Visit: Payer: Self-pay | Admitting: Medical Genetics

## 2023-10-28 ENCOUNTER — Other Ambulatory Visit: Payer: Self-pay

## 2023-10-28 DIAGNOSIS — R350 Frequency of micturition: Secondary | ICD-10-CM | POA: Insufficient documentation

## 2023-10-28 DIAGNOSIS — E785 Hyperlipidemia, unspecified: Secondary | ICD-10-CM | POA: Insufficient documentation

## 2023-10-28 DIAGNOSIS — L732 Hidradenitis suppurativa: Secondary | ICD-10-CM | POA: Insufficient documentation

## 2023-10-28 DIAGNOSIS — I729 Aneurysm of unspecified site: Secondary | ICD-10-CM | POA: Insufficient documentation

## 2023-10-28 DIAGNOSIS — E119 Type 2 diabetes mellitus without complications: Secondary | ICD-10-CM | POA: Insufficient documentation

## 2023-10-28 DIAGNOSIS — R011 Cardiac murmur, unspecified: Secondary | ICD-10-CM | POA: Insufficient documentation

## 2023-10-28 DIAGNOSIS — F419 Anxiety disorder, unspecified: Secondary | ICD-10-CM | POA: Insufficient documentation

## 2023-10-28 DIAGNOSIS — E538 Deficiency of other specified B group vitamins: Secondary | ICD-10-CM | POA: Insufficient documentation

## 2023-10-28 DIAGNOSIS — R600 Localized edema: Secondary | ICD-10-CM | POA: Insufficient documentation

## 2023-10-28 DIAGNOSIS — G4730 Sleep apnea, unspecified: Secondary | ICD-10-CM | POA: Insufficient documentation

## 2023-10-28 DIAGNOSIS — E1165 Type 2 diabetes mellitus with hyperglycemia: Secondary | ICD-10-CM | POA: Insufficient documentation

## 2023-10-28 DIAGNOSIS — I10 Essential (primary) hypertension: Secondary | ICD-10-CM | POA: Insufficient documentation

## 2023-10-29 ENCOUNTER — Other Ambulatory Visit: Payer: Self-pay

## 2023-10-29 ENCOUNTER — Ambulatory Visit

## 2023-10-29 VITALS — BP 138/88 | HR 84 | Ht 64.0 in | Wt 138.4 lb

## 2023-10-29 DIAGNOSIS — I10 Essential (primary) hypertension: Secondary | ICD-10-CM | POA: Diagnosis not present

## 2023-10-29 DIAGNOSIS — E785 Hyperlipidemia, unspecified: Secondary | ICD-10-CM

## 2023-10-29 DIAGNOSIS — R011 Cardiac murmur, unspecified: Secondary | ICD-10-CM

## 2023-10-29 DIAGNOSIS — R42 Dizziness and giddiness: Secondary | ICD-10-CM | POA: Insufficient documentation

## 2023-10-29 DIAGNOSIS — R600 Localized edema: Secondary | ICD-10-CM

## 2023-10-29 MED ORDER — OLMESARTAN MEDOXOMIL 40 MG PO TABS: 40.0000 mg | ORAL_TABLET | Freq: Every day | ORAL | 3 refills | Status: AC

## 2023-10-29 NOTE — Assessment & Plan Note (Signed)
 Based on physical exam findings appears likely related to aortic stenosis.  Proceed with transthoracic echocardiogram

## 2023-10-29 NOTE — Assessment & Plan Note (Signed)
 Suboptimal control. Blood pressures typically at home and systolic 130s and diastolic in 80s. Continue current doses of metoprolol succinate 25 mg once daily. Titrate up olmesartan  to 40 mg once daily.

## 2023-10-29 NOTE — Patient Instructions (Addendum)
 Medication Instructions:  Your physician has recommended you make the following change in your medication:   Olmesartan  40 mg daily  *If you need a refill on your cardiac medications before your next appointment, please call your pharmacy*  Lab Work: Your physician recommends that you return for lab work in:   Labs today: Pro BNP  If you have labs (blood work) drawn today and your tests are completely normal, you will receive your results only by: MyChart Message (if you have MyChart) OR A paper copy in the mail If you have any lab test that is abnormal or we need to change your treatment, we will call you to review the results.  Testing/Procedures: Your physician has requested that you have an echocardiogram. Echocardiography is a painless test that uses sound waves to create images of your heart. It provides your doctor with information about the size and shape of your heart and how well your heart's chambers and valves are working. This procedure takes approximately one hour. There are no restrictions for this procedure. Please do NOT wear cologne, perfume, aftershave, or lotions (deodorant is allowed). Please arrive 15 minutes prior to your appointment time.  Please note: We ask at that you not bring children with you during ultrasound (echo/ vascular) testing. Due to room size and safety concerns, children are not allowed in the ultrasound rooms during exams. Our front office staff cannot provide observation of children in our lobby area while testing is being conducted. An adult accompanying a patient to their appointment will only be allowed in the ultrasound room at the discretion of the ultrasound technician under special circumstances. We apologize for any inconvenience.   Follow-Up: At [facility], you and your health needs are our priority.  As part of our continuing mission to provide you with exceptional heart care, our providers are all part of one team.  This team  includes your primary Cardiologist (physician) and Advanced Practice Providers or APPs (Physician Assistants and Nurse Practitioners) who all work together to provide you with the care you need, when you need it.  Your next appointment:   2 month(s)  Provider:   Alean Kobus, MD    We recommend signing up for the patient portal called MyChart.  Sign up information is provided on this After Visit Summary.  MyChart is used to connect with patients for Virtual Visits (Telemedicine).  Patients are able to view lab/test results, encounter notes, upcoming appointments, etc.  Non-urgent messages can be sent to your provider as well.   To learn more about what you can do with MyChart, go to [email or web address].   Other Instructions None

## 2023-10-29 NOTE — Assessment & Plan Note (Signed)
 She reports symptoms of bilateral lower extremity edema. However no significant edema noted today on clinical exam. Advised to keep herself well-hydrated and avoid salt consumption. Will obtain proBNP. Will review echocardiogram results for systolic and diastolic function.

## 2023-10-29 NOTE — Assessment & Plan Note (Signed)
 Closely follows up with her PCP. Last lipid panel 04/15/2023 total cholesterol 123, HDL 44, LDL 66, triglycerides 60. Improved significantly with weight loss on Ozempic therapy. Remains on lipid-lowering therapy with atorvastatin 80 mg once daily. Continue the same.

## 2023-10-29 NOTE — Assessment & Plan Note (Signed)
 To be triggered after standing up for an extended time. No syncopal episodes. Advised to keep herself hydrated. Advised to cut down on caffeinated drink consumption. Advised to sit down or lay down immediately and the onset of symptoms. If more prominent symptoms occurring when at rest or associated with any palpitations, will obtain heart monitor.. Will review echocardiogram results as being requested for evaluation of systolic murmur.

## 2023-10-29 NOTE — Progress Notes (Signed)
 Plan for: Sleeping emina mother patient's tachycardia  Cardiology Consultation:    Date:  10/29/2023   ID:  Renee Stevens, DOB 05/05/1970, MRN [id number]  PCP:  Renee Anthony RAMAN, FNP  Cardiologist:  Renee SAUNDERS Courtland Coppa, MD   Referring MD: Renee Anthony RAMAN, FNP   No chief complaint on file.    ASSESSMENT AND PLAN:   Renee Stevens 53 year old woman with history of hypertension, diabetes mellitus, hyperlipidemia, migraines, OSA-does not use CPAP, chronic back pain, tobacco use [previously smoked tobacco; currently using nicotine vape] mild bilateral lower extremity dependent edema.  Also had history of obesity but significant weight loss and improvement since being on Ozempic [last over 100 pounds in the past 5 years, in the past 1 year she has lost close to 80 pounds].  Lexiscan  stress with nuclear imaging from September 2018 showed no ischemia. Transthoracic echocardiogram September 2018 noted normal biventricular function LVEF 60 to 65%, wall Increase gradients across aortic valve with mean gradient 8 mmHg and valve area by VTI method 1.97 cm.   Here for further evaluation of symptoms of bilateral lower extremity edema, dizziness symptoms at times while standing up and systolic murmur on physical exam.  Problem List Items Addressed This Visit     Essential (primary) hypertension   Suboptimal control. Blood pressures typically at home and systolic 130s and diastolic in 80s. Continue current doses of metoprolol succinate 25 mg once daily. Titrate up olmesartan  to 40 mg once daily.       Relevant Medications   olmesartan  (BENICAR ) 40 MG tablet   Other Relevant Orders   ECHOCARDIOGRAM COMPLETE   Pro b natriuretic peptide (BNP)   Systolic murmur - Primary   Based on physical exam findings appears likely related to aortic stenosis.  Proceed with transthoracic echocardiogram      Relevant Orders   ECHOCARDIOGRAM COMPLETE   Pro b natriuretic peptide (BNP)   Hyperlipidemia    Closely follows up with her PCP. Last lipid panel 04/15/2023 total cholesterol 123, HDL 44, LDL 66, triglycerides 60. Improved significantly with weight loss on Ozempic therapy. Remains on lipid-lowering therapy with atorvastatin 80 mg once daily. Continue the same.       Relevant Medications   olmesartan  (BENICAR ) 40 MG tablet   Other Relevant Orders   EKG 12-Lead (Completed)   ECHOCARDIOGRAM COMPLETE   Pro b natriuretic peptide (BNP)   Bilateral lower extremity edema   She reports symptoms of bilateral lower extremity edema. However no significant edema noted today on clinical exam. Advised to keep herself well-hydrated and avoid salt consumption. Will obtain proBNP. Will review echocardiogram results for systolic and diastolic function.      Lightheadedness   To be triggered after standing up for an extended time. No syncopal episodes. Advised to keep herself hydrated. Advised to cut down on caffeinated drink consumption. Advised to sit down or lay down immediately and the onset of symptoms. If more prominent symptoms occurring when at rest or associated with any palpitations, will obtain heart monitor.. Will review echocardiogram results as being requested for evaluation of systolic murmur.       Return to clinic tentatively in 2 months  History of Present Illness:    Renee Stevens is a 53 y.o. female who is being seen today for the evaluation of heart murmur at the request of Renee Anthony RAMAN, FNP. Previously followed up with Dr. Edwyna back in January 2021   Has history of hypertension, diabetes mellitus, hyperlipidemia, migraines, OSA-does not use  CPAP, chronic back pain, tobacco use [previously smoked tobacco; currently using nicotine vape] mild bilateral lower extremity dependent edema.  Also had history of obesity but significant weight loss and improvement since being on Ozempic [last over 100 pounds in the past 5 years, in the past 1 year she has lost close to  80 pounds].  Lexiscan  stress with nuclear imaging from September 2018 showed no ischemia. Transthoracic echocardiogram September 2018 noted normal biventricular function LVEF 60 to 65%, wall Increase gradients across aortic valve with mean gradient 8 mmHg and valve area by VTI method 1.97 cm.  Referred for further evaluation of systolic murmur noted at recent PCPs office visit 10/27/2023.  Pleasant woman here for the visit by herself.  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  Does her job without any significant limitations.  Drinks a bottle of Mountain Dew 16 ounces through her shift.  Mentions over the past few months she has been noticing symptoms of bilateral lower extremity edema to her shift.  Resolves as she has her feet up after sleeping. Mentions episodes of dizziness that she describes as a sense of lightheadedness while standing up and quickly resolved once she sits down.  No symptoms while at rest.  No significant symptoms with exertion. No syncopal disorders. No chest pain, shortness of breath, orthopnea or paroxysmal nocturnal dyspnea.  Describes she is sometimes feels lack of breath as she is speaking and has to pause.  Does not report any sense of shortness of breath with her activities or moderate physical exertion at home or at her job.  EKG in the clinic today shows sinus rhythm heart rate 84/min, PR interval 172 ms, QRS duration 80 ms, no ischemic changes.  Normal QTc 432 ms.  Blood work from 10/27/2023 notes BUN 13, creatinine 0.63 Sodium 139 and potassium 4.2. 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 08/18/2017    Essential (primary) hypertension    Essential hypertension 03/12/2019   Heart murmur    Hidradenitis    Hyperlipidemia    Hypertension    Increased urinary frequency    Long term current use of oral hypoglycemic drug 08/18/2017   Lower extremity edema    Mixed dyslipidemia 03/12/2019   Nuclear sclerotic cataract of both eyes 08/18/2017   Presbyopia of both eyes 08/18/2017   Sleep apnea    Spinal stenosis of lumbar region 04/11/2017   Type 2 diabetes mellitus with hyperglycemia (HCC)    Vitamin B12 deficiency    Vitreous floaters of both eyes 08/18/2017    Past Surgical History:  Procedure Laterality Date   LEEP     1990 AND 2008    Current Medications: Current Meds  Medication Sig   atorvastatin (LIPITOR) 80 MG tablet Take 80 mg by mouth daily.   gabapentin  (NEURONTIN ) 100 MG capsule Take 300 mg by mouth at bedtime as needed (feet and hands).   JARDIANCE 25 MG TABS tablet Take 25 mg by mouth daily.   metFORMIN (GLUCOPHAGE) 500 MG tablet Take 1,000 mg by mouth 2 (two) times daily with a meal.    metoprolol succinate (TOPROL-XL) 25 MG 24 hr tablet Take 25 mg by mouth daily.  olmesartan  (BENICAR ) 40 MG tablet Take 1 tablet (40 mg total) by mouth daily.   ondansetron  (ZOFRAN ) 8 MG tablet Take 4-8 mg by mouth as needed for nausea or vomiting.   OZEMPIC, 0.25 OR 0.5 MG/DOSE, 2 MG/3ML SOPN Inject 0.5 mLs into the skin once a week.   Vitamin D, Ergocalciferol, (DRISDOL) 50000 UNITS CAPS capsule Take 50,000 Units by mouth every 7 (seven) days. Twice Weekly   [DISCONTINUED] olmesartan  (BENICAR ) 20 MG tablet Take 40 mg by mouth daily.     Allergies:   Patient has no known allergies.   Social History   Socioeconomic History   Marital status: Married    Spouse name: Not on file   Number of children: Not on file   Years of education: Not on file   Highest education level: Not on file  Occupational History   Not on file  Tobacco Use   Smoking status: Former    Current  packs/day: 0.00    Types: Cigarettes    Quit date: 02/02/2012    Years since quitting: 11.7   Smokeless tobacco: Never  Vaping Use   Vaping status: Every Day  Substance and Sexual Activity   Alcohol use: No    Alcohol/week: 0.0 standard drinks of alcohol   Drug use: No   Sexual activity: Yes    Partners: Male  Other Topics Concern   Not on file  Social History Narrative   Not on file   Social Drivers of Health   Financial Resource Strain: Not on file  Food Insecurity: Not on file  Transportation Needs: Not on file  Physical Activity: Not on file  Stress: Not on file  Social Connections: Not on file     Family History: The patient's family history includes Aneurysm in her sister; Hypertension in her mother; Stroke in her maternal grandmother. ROS:   Please see the history of present illness.    All 14 point review of systems negative except as described per history of present illness.  EKGs/Labs/Other Studies Reviewed:    The following studies were reviewed today:   EKG:  EKG Interpretation Date/Time:  Wednesday October 29 2023 12:59:59 EDT Ventricular Rate:  84 PR Interval:  172 QRS Duration:  80 QT Interval:  366 QTC Calculation: 432 R Axis:   -8  Text Interpretation: Normal sinus rhythm Normal ECG When compared with ECG of 05-Mar-2023 03:46, PREVIOUS ECG IS PRESENT Confirmed by Liborio Hai reddy [phone]) on 10/29/2023 1:08:47 PM    Recent Labs: 03/05/2023: ALT 13; BUN 11; Creatinine, Ser 0.65; Hemoglobin 14.5; Magnesium 2.2; Platelets 323; Potassium 3.6; Sodium 137  Recent Lipid Panel No results found for: CHOL, TRIG, HDL, CHOLHDL, VLDL, LDLCALC, LDLDIRECT  Physical Exam:    VS:  BP 138/88   Pulse 84   Ht 5' 4 (1.626 m)   Wt 138 lb 6.4 oz (62.8 kg)   SpO2 95%   BMI 23.76 kg/m     Wt Readings from Last 3 Encounters:  10/29/23 138 lb 6.4 oz (62.8 kg)  03/05/23 140 lb (63.5 kg)  03/12/19 217 lb (98.4 kg)     GENERAL:  Well  nourished, well developed in no acute distress NECK: No JVD; No carotid bruits CARDIAC: RRR, S1 and S2 present, 3/6 ejection systolic murmur best heard right upper sternal border, no radiation to the carotids. CHEST:  Clear to auscultation without rales, wheezing or rhonchi  Extremities: No pitting pedal edema. Pulses bilaterally symmetric with radial 2+ and dorsalis pedis 2+  NEUROLOGIC:  Alert and oriented x 3  Medication Adjustments/Labs and Tests Ordered: Current medicines are reviewed at length with the patient today.  Concerns regarding medicines are outlined above.  Orders Placed This Encounter  Procedures   Pro b natriuretic peptide (BNP)   EKG 12-Lead   ECHOCARDIOGRAM COMPLETE   Meds ordered this encounter  Medications   olmesartan  (BENICAR ) 40 MG tablet    Sig: Take 1 tablet (40 mg total) by mouth daily.    Dispense:  90 tablet    Refill:  3    Signed, Sevin Langenbach reddy Elizabeht Suto, MD, MPH, [facility]. 10/29/2023 1:34 PM    [facility]

## 2023-10-30 LAB — PRO B NATRIURETIC PEPTIDE: NT-Pro BNP: 48 pg/mL (ref 0–249)

## 2023-11-01 ENCOUNTER — Ambulatory Visit: Payer: Self-pay

## 2023-11-10 ENCOUNTER — Other Ambulatory Visit: Payer: Self-pay

## 2023-11-10 DIAGNOSIS — R011 Cardiac murmur, unspecified: Secondary | ICD-10-CM

## 2023-11-10 DIAGNOSIS — I10 Essential (primary) hypertension: Secondary | ICD-10-CM

## 2023-11-10 DIAGNOSIS — R600 Localized edema: Secondary | ICD-10-CM

## 2023-11-10 DIAGNOSIS — R42 Dizziness and giddiness: Secondary | ICD-10-CM

## 2023-11-10 DIAGNOSIS — E785 Hyperlipidemia, unspecified: Secondary | ICD-10-CM

## 2023-11-25 ENCOUNTER — Ambulatory Visit (HOSPITAL_BASED_OUTPATIENT_CLINIC_OR_DEPARTMENT_OTHER)
Admission: RE | Admit: 2023-11-25 | Discharge: 2023-11-25 | Disposition: A | Discharge status: Home / Self Care | Source: Ambulatory Visit

## 2023-11-25 ENCOUNTER — Ambulatory Visit (HOSPITAL_BASED_OUTPATIENT_CLINIC_OR_DEPARTMENT_OTHER)

## 2023-11-25 DIAGNOSIS — I517 Cardiomegaly: Secondary | ICD-10-CM | POA: Diagnosis not present

## 2023-11-25 DIAGNOSIS — R42 Dizziness and giddiness: Secondary | ICD-10-CM | POA: Diagnosis present

## 2023-11-25 DIAGNOSIS — R011 Cardiac murmur, unspecified: Secondary | ICD-10-CM

## 2023-11-25 DIAGNOSIS — R600 Localized edema: Secondary | ICD-10-CM

## 2023-11-25 LAB — ECHOCARDIOGRAM COMPLETE
AR max vel: 1.53 cm2
AV Area VTI: 1.82 cm2
AV Area mean vel: 1.59 cm2
AV Mean grad: 7 mmHg
AV Peak grad: 13.5 mmHg
Ao pk vel: 1.84 m/s
Area-P 1/2: 6.12 cm2
Calc EF: 64.1 %
S' Lateral: 2.1 cm
Single Plane A2C EF: 62.8 %
Single Plane A4C EF: 65.4 %

## 2023-12-12 ENCOUNTER — Other Ambulatory Visit: Payer: Self-pay | Admitting: Medical Genetics

## 2023-12-12 DIAGNOSIS — Z006 Encounter for examination for normal comparison and control in clinical research program: Secondary | ICD-10-CM

## 2024-01-01 ENCOUNTER — Ambulatory Visit

## 2024-03-11 ENCOUNTER — Other Ambulatory Visit: Payer: Self-pay | Admitting: Vascular Surgery

## 2024-03-11 DIAGNOSIS — I6522 Occlusion and stenosis of left carotid artery: Secondary | ICD-10-CM

## 2024-04-02 ENCOUNTER — Ambulatory Visit (HOSPITAL_COMMUNITY)

## 2024-04-02 ENCOUNTER — Encounter: Admitting: Vascular Surgery

## 2024-04-23 ENCOUNTER — Institutional Professional Consult (permissible substitution) (HOSPITAL_BASED_OUTPATIENT_CLINIC_OR_DEPARTMENT_OTHER): Admitting: Internal Medicine

## 2024-05-07 ENCOUNTER — Institutional Professional Consult (permissible substitution) (HOSPITAL_BASED_OUTPATIENT_CLINIC_OR_DEPARTMENT_OTHER): Admitting: Internal Medicine
